# Patient Record
Sex: Female | Born: 1935 | Race: Black or African American | Hispanic: No | Marital: Married | Smoking: Never smoker
Health system: Southern US, Community
[De-identification: ages and names within clinical notes are randomized; demographics above are authoritative.]

## PROBLEM LIST (undated history)

## (undated) DIAGNOSIS — H269 Unspecified cataract: Secondary | ICD-10-CM

## (undated) DIAGNOSIS — I639 Cerebral infarction, unspecified: Secondary | ICD-10-CM

## (undated) DIAGNOSIS — E785 Hyperlipidemia, unspecified: Secondary | ICD-10-CM

## (undated) DIAGNOSIS — F329 Major depressive disorder, single episode, unspecified: Secondary | ICD-10-CM

## (undated) DIAGNOSIS — F32A Depression, unspecified: Secondary | ICD-10-CM

## (undated) DIAGNOSIS — I10 Essential (primary) hypertension: Secondary | ICD-10-CM

## (undated) HISTORY — DX: Major depressive disorder, single episode, unspecified: F32.9

## (undated) HISTORY — PX: TUBAL LIGATION: SHX77

## (undated) HISTORY — PX: OTHER SURGICAL HISTORY: SHX169

## (undated) HISTORY — PX: HERNIA REPAIR: SHX51

## (undated) HISTORY — DX: Depression, unspecified: F32.A

## (undated) HISTORY — DX: Hyperlipidemia, unspecified: E78.5

## (undated) HISTORY — DX: Unspecified cataract: H26.9

## (undated) HISTORY — PX: TONSILLECTOMY: SUR1361

## (undated) HISTORY — DX: Cerebral infarction, unspecified: I63.9

---

## 1998-08-22 ENCOUNTER — Other Ambulatory Visit: Admission: RE | Admit: 1998-08-22 | Discharge: 1998-08-22 | Payer: Self-pay | Admitting: *Deleted

## 2013-07-30 ENCOUNTER — Ambulatory Visit (INDEPENDENT_AMBULATORY_CARE_PROVIDER_SITE_OTHER): Payer: Medicare Other | Admitting: Internal Medicine

## 2013-07-30 VITALS — BP 172/94 | HR 102 | Temp 99.0°F | Resp 16 | Ht 65.0 in | Wt 136.6 lb

## 2013-07-30 DIAGNOSIS — R030 Elevated blood-pressure reading, without diagnosis of hypertension: Secondary | ICD-10-CM

## 2013-07-30 DIAGNOSIS — R32 Unspecified urinary incontinence: Secondary | ICD-10-CM

## 2013-07-30 DIAGNOSIS — IMO0001 Reserved for inherently not codable concepts without codable children: Secondary | ICD-10-CM

## 2013-07-30 DIAGNOSIS — R350 Frequency of micturition: Secondary | ICD-10-CM

## 2013-07-30 DIAGNOSIS — F411 Generalized anxiety disorder: Secondary | ICD-10-CM

## 2013-07-30 LAB — POCT UA - MICROSCOPIC ONLY
Bacteria, U Microscopic: NEGATIVE
CASTS, UR, LPF, POC: NEGATIVE
CRYSTALS, UR, HPF, POC: NEGATIVE
Mucus, UA: NEGATIVE
Yeast, UA: NEGATIVE

## 2013-07-30 LAB — POCT URINALYSIS DIPSTICK
Bilirubin, UA: NEGATIVE
Blood, UA: NEGATIVE
Glucose, UA: NEGATIVE
KETONES UA: NEGATIVE
LEUKOCYTES UA: NEGATIVE
Nitrite, UA: NEGATIVE
PROTEIN UA: NEGATIVE
Spec Grav, UA: 1.01
UROBILINOGEN UA: 0.2
pH, UA: 5

## 2013-07-30 MED ORDER — SERTRALINE HCL 25 MG PO TABS: 25.0000 mg | ORAL_TABLET | Freq: Every day | ORAL | Status: DC

## 2013-07-30 MED ORDER — NITROFURANTOIN MONOHYD MACRO 100 MG PO CAPS: 100.0000 mg | ORAL_CAPSULE | Freq: Two times a day (BID) | ORAL | Status: DC

## 2013-07-30 NOTE — Progress Notes (Addendum)
Subjective:    Patient ID: Carrie Riley, female    DOB: 14-Sep-1932, 80 y.o.   MRN: [id number]  This chart was scribed for Carrie Siaobert Silvestre Mines, MD by Jarvis Morganaylor Ferguson, Medical Scribe. This patient was seen in Room 1 and the patient's care was started at 12:38 PM. She was referred to me by her granddaughter. Her most recent primary care has been [facility], and  she lives in [place] and would rather not continue to commute. She recently moved to our community.  Chief Complaint  Patient presents with  . Urinary Frequency    x's 1 day  . Hypertension    elevated BP x's 3 days     HPI HPI Comments: Carrie Riley is a 80 y.o. female who presents to the [facility] complaining of possible UTI. Patient states that she has a history of UTI's and she is having similar symptoms to what she had when she had past UTI-although she says her last UTI was discovered after being hospitalized for a fall and injury and that she did not know she had a urinary tract infection at the time. She states she has been hospitalized previously for a bladder infection. She states she is having associated urinary frequency with no nocturia, no fever ordiaphoresis. (After the exam is completed and the lab results discussed she acknowledges that she has had incontinence with urgency on several occasions in the last 24 hours for the first time ever)   Patient states she has a personal history of hypertension but she is not taking any medication for it. Her blood pressure is usually 140-150 systolic at home although she has had elevations particularly when anxious that are much higher than this. She states that she is having some anxiety due to her husband's colon cancer diagnosis. She states that at times is interrupts her sleep. She states that she has also has a personal history of irregular heart beat. She denies any other cardiology problems or h/o MI's. She denies any abdominal pain, fever, flank pain  or edema. Although her husband is better they continue to have significant differences in lifestyle revolving around eating and sleeping which cause her to have great anxiety and disrupted sleep to some degree.  I reviewed the records from  [facility] which display a healthy woman for the most part who has been reluctant to use any medications for hypertension because her home blood pressures appear normal to her and he did not respond to Xanax for her anxiety. She has been reluctant to followup for pulmonary nodule as well. Questions about adrenal adenomas were resolved with a second CT scan in June of 2014.     Review of Systems  Constitutional: Positive for diaphoresis (upon waking up in the morning). Negative for fever.  Cardiovascular: Negative for chest pain, palpitations and leg swelling.  Gastrointestinal: Negative for abdominal pain.  Genitourinary: Positive for frequency. Negative for dysuria, flank pain and decreased urine volume.       Nocturia noted  All other systems reviewed and are negative.      Objective:   Physical Exam  Nursing note and vitals reviewed. Constitutional: She is oriented to person, place, and time. She appears well-developed and well-nourished. No distress.  HENT:  Head: Normocephalic and atraumatic.  Eyes: Conjunctivae and EOM are normal.  Neck: Normal range of motion. No tracheal deviation present.  Cardiovascular: Normal rate.  Exam reveals no gallop and no friction rub.   No murmur heard. Tachycardia Rate  of 100. No carotid bruits. No peripheral edema  Pulmonary/Chest: Effort normal and breath sounds normal. No respiratory distress.  Musculoskeletal: Normal range of motion.  Neurological: She is alert and oriented to person, place, and time.  Skin: Skin is warm and dry.  Psychiatric: She has a normal mood and affect. Her behavior is normal.          Assessment & Plan:  I have completed the patient encounter in its entirety as documented  by the scribe, with editing by me where necessary. Canio Winokur P. Merla Richesoolittle, M.D.  Urinary frequency - Plan: POCT UA - Microscopic Only, POCT urinalysis dipstick, Urine culture  Incontinence of urine-2d hx so will start antibios  Anxiety state, unspecified--needsmeds  Spouse with colon cancer  Elevated BP---follow for now  To estab PCP at 104--appt 2 weeks  Meds ordered this encounter  Medications  . nitrofurantoin, macrocrystal-monohydrate, (MACROBID) 100 MG capsule    Sig: Take 1 capsule (100 mg total) by mouth 2 (two) times daily.    Dispense:  14 capsule    Refill:  0  . sertraline (ZOLOFT) 25 MG tablet    Sig: Take 1 tablet (25 mg total) by mouth daily.    Dispense:  30 tablet    Refill:  1

## 2013-07-31 LAB — URINE CULTURE: Colony Count: 2000

## 2013-08-03 ENCOUNTER — Telehealth: Payer: Self-pay

## 2013-08-03 NOTE — Telephone Encounter (Signed)
PATIENT STATES SHE CAME INTO THE OFFICE TO SEE DR. DOOLITTLE FOR A FEW PROBLEMS ON Saturday. SHE JUST WANTS HIM TO KNOW  THAT SHE WILL FOLLOW-UP WITH HER PRIMARY CARE PHYSICIAN. SHE WILL RETURN BACK TO [facility] IF SHE HAS ANOTHER EMERGENCY. BEST PHONE [phone] (HOME)   MBC

## 2013-08-06 ENCOUNTER — Other Ambulatory Visit: Payer: Self-pay | Admitting: Internal Medicine

## 2013-08-09 ENCOUNTER — Encounter: Payer: Self-pay | Admitting: Radiology

## 2013-08-14 ENCOUNTER — Telehealth: Payer: Self-pay | Admitting: *Deleted

## 2013-08-14 NOTE — Telephone Encounter (Signed)
Pt reports she received unable to reach letter in mail regarding lab work. I informed her of results. She reports that the incontinence she was having has sense stopped since taking medication she was prescribed .

## 2013-10-18 HISTORY — PX: CATARACT EXTRACTION: SUR2

## 2014-05-11 ENCOUNTER — Ambulatory Visit (INDEPENDENT_AMBULATORY_CARE_PROVIDER_SITE_OTHER): Payer: Medicare Other | Admitting: Family Medicine

## 2014-05-11 VITALS — BP 166/80 | HR 102 | Temp 98.3°F | Resp 18 | Ht 62.0 in | Wt 138.2 lb

## 2014-05-11 DIAGNOSIS — I10 Essential (primary) hypertension: Secondary | ICD-10-CM | POA: Diagnosis not present

## 2014-05-11 DIAGNOSIS — N3941 Urge incontinence: Secondary | ICD-10-CM | POA: Diagnosis not present

## 2014-05-11 DIAGNOSIS — S9001XA Contusion of right ankle, initial encounter: Secondary | ICD-10-CM

## 2014-05-11 DIAGNOSIS — W19XXXA Unspecified fall, initial encounter: Secondary | ICD-10-CM | POA: Diagnosis not present

## 2014-05-11 DIAGNOSIS — S8001XA Contusion of right knee, initial encounter: Secondary | ICD-10-CM | POA: Diagnosis not present

## 2014-05-11 LAB — POCT UA - MICROSCOPIC ONLY
Casts, Ur, LPF, POC: NEGATIVE
Crystals, Ur, HPF, POC: NEGATIVE
Mucus, UA: NEGATIVE
YEAST UA: NEGATIVE

## 2014-05-11 LAB — POCT URINALYSIS DIPSTICK
BILIRUBIN UA: NEGATIVE
Glucose, UA: NEGATIVE
KETONES UA: NEGATIVE
Nitrite, UA: NEGATIVE
Protein, UA: NEGATIVE
Spec Grav, UA: 1.01
Urobilinogen, UA: 0.2
pH, UA: 6

## 2014-05-11 MED ORDER — CIPROFLOXACIN HCL 250 MG PO TABS: 250.0000 mg | ORAL_TABLET | Freq: Two times a day (BID) | ORAL | Status: DC

## 2014-05-11 MED ORDER — DOCUSATE SODIUM 100 MG PO CAPS: 100.0000 mg | ORAL_CAPSULE | Freq: Two times a day (BID) | ORAL | Status: DC

## 2014-05-11 NOTE — Progress Notes (Addendum)
Subjective:   This chart was scribed for Carrie Simmer, MD by Carrie Riley, ED Scribe. This patient was seen in Room 13 and the patient's care was started at 9:22 PM.   Patient ID: Carrie Riley, female    DOB: 1932/07/11, 81 y.o.   MRN: [id number]  05/11/2014  Urinary Incontinence and Leg Swelling  HPI  HPI Comments: Carrie Riley is a 81 y.o. female who presents to the [facility] complaining of a possible UTI, pt is unsure to when onset of symptoms began. She is having associated urinary frequency, urgency and urinary incontinence. Pt states that she sometimes wears depends. She has also been having constipation for 1 week in which she takes laxatives. Pt was last seen at [facility] in June 2015 for UTI like symptoms. Per notes from that visit pt tested negative for UTI by Urine culture.  Urinary incontinence improved within one week of treatment of presumed UTI.     Pt began a new BP medication, amlodipine, in January 2016. Pt regularly checks her BP at home. Pt states she has diaphoresis intermittently at baseline since she went through menopause. She states she recently fell twice today and has swelling in her right leg and pain in right ankle. Pt notes she has bilateral swelling in her legs at baseline. She denies pain with ambulation. She denies twisting her ankle but does have some mild R lateral ankle pain s/p fall today. Pt regularly exercises and tries to walk about 1 mile each day. She denies dysuria, back pain, hematuria, fever, chills, nausea, vomiting, abdominal pain, chest pain, SOB, dizziness or HA.  She is not sure why she fell twice today but reports that she fell when she was suffering with urinary incontinence nine months ago.  Denies n/t in legs.  Denies lower back pain.  Denies saddle paresthesias.  Having some mild R lateral knee pain as well but no pain in knee with ambulation or range of motion of R knee.  Upon review of recent visit with NP Regan, pt was  advised to take both Amlodipine and Losartan. Pt has been taking Amlodipine but has not been taking Losartan. Pt is worried about elevated BP today however does not want to take both medications. Pt wants to work on diet to improve BP.  Denies HA/dizziness/paresthesias/focal weakness.  Denies CP/palp/SOB.   PCP: Louie Boston, NP  Review of Systems  Constitutional: Negative for fever, chills, diaphoresis and fatigue.  HENT: Negative for congestion.   Respiratory: Negative for cough and shortness of breath.   Cardiovascular: Positive for leg swelling. Negative for chest pain and palpitations.  Gastrointestinal: Positive for constipation. Negative for nausea, vomiting, abdominal pain, diarrhea, blood in stool, abdominal distention, anal bleeding and rectal pain.  Genitourinary: Positive for urgency and frequency. Negative for dysuria, hematuria, flank pain and pelvic pain.  Musculoskeletal: Positive for myalgias, joint swelling and arthralgias. Negative for back pain, neck pain and neck stiffness.  Skin: Negative for wound.  Neurological: Negative for dizziness, tremors, syncope, facial asymmetry, speech difficulty, weakness, light-headedness, numbness and headaches.  Hematological: Does not bruise/bleed easily.    Past Medical History  Diagnosis Date  . Cataract   . Hypertension   . Depression    Past Surgical History  Procedure Laterality Date  . Tubal ligation     No Known Allergies History   Social History  . Marital Status: Married    Spouse Name: N/A  . Number of Children: N/A  . Years of  Education: N/A   Occupational History  . Not on file.   Social History Main Topics  . Smoking status: Never Smoker   . Smokeless tobacco: Not on file  . Alcohol Use: Not on file  . Drug Use: Not on file  . Sexual Activity: Not on file   Other Topics Concern  . Not on file   Social History Narrative        Objective:    Triage Vitals: BP 166/80 mmHg  Pulse 102  Temp(Src)  98.3 F (36.8 C) (Oral)  Resp 18  Ht 5\' 2"  (1.575 m)  Wt 138 lb 4 oz (62.71 kg)  BMI 25.28 kg/m2  SpO2 98%  Physical Exam  Constitutional: She is oriented to person, place, and time. She appears well-developed and well-nourished. No distress.  HENT:  Head: Normocephalic and atraumatic.  Right Ear: External ear normal.  Left Ear: External ear normal.  Nose: Nose normal.  Mouth/Throat: Oropharynx is clear and moist.  Eyes: Conjunctivae and EOM are normal. Pupils are equal, round, and reactive to light.  Neck: Normal range of motion. Neck supple. Carotid bruit is not present. No tracheal deviation present. No thyromegaly present.  Cardiovascular: Normal rate, regular rhythm, normal heart sounds and intact distal pulses.  Exam reveals no gallop and no friction rub.   No murmur heard. Pulmonary/Chest: Effort normal and breath sounds normal. No respiratory distress. She has no wheezes. She has no rales.  Abdominal: Soft. Bowel sounds are normal. She exhibits no distension and no mass. There is no tenderness. There is no rebound, no guarding and no CVA tenderness.  Musculoskeletal:       Right knee: She exhibits normal range of motion, no swelling and no effusion. Tenderness found. Lateral joint line tenderness noted. No medial joint line and no patellar tendon tenderness noted.       Right ankle: She exhibits swelling. She exhibits normal range of motion. Tenderness. Lateral malleolus tenderness found. No medial malleolus, no posterior TFL, no head of 5th metatarsal and no proximal fibula tenderness found. Achilles tendon normal.       Lumbar back: She exhibits normal range of motion, no tenderness, no bony tenderness, no pain and no spasm.       Right lower leg: Normal. She exhibits no tenderness, no bony tenderness, no swelling and no edema.       Right foot: Normal. There is normal range of motion, no tenderness and no bony tenderness.  Trace pitting edema bilateral of lower extremities.  Mildly painful ROM of right ankle. Tenderness to lateral malleolus.  Normal gait.  Full ROM of R knee without limitation and without pain.   Lymphadenopathy:    She has no cervical adenopathy.  Neurological: She is alert and oriented to person, place, and time. No cranial nerve deficit. She exhibits normal muscle tone. Coordination normal.  Skin: Skin is warm and dry. No rash noted. She is not diaphoretic. No erythema. No pallor.  Psychiatric: She has a normal mood and affect. Her behavior is normal. Judgment and thought content normal.  Nursing note and vitals reviewed.       Assessment & Plan:   1. Urge incontinence of urine   2. Essential hypertension, benign   3. Falls, initial encounter   4. Contusion of right knee, initial encounter   5. Contusion of right ankle, initial encounter     1.  Urge Incontinence:  New/recurrent. Send urine culture. Treat empirically with Cipro while awaiting cx.  Treat  constipation. 2.  HTN:  Elevated; encourage patient to restart Losartan and to continue Amlodipine. Goal BP for age is 150/80. 3. Falls recurrent:  New today; normal neurological exam; benign exam in office.  Obtain labs. May be related to acute UTI.  If recurs, recommend follow-up with PCP. 4.  Contusion R lateral ankle: New. Pt declined xray; normal gait; recommend rest, ice, elevation, supportive shoes. 5. Contusion R lateral knee: New. Secondary to fall; full ROM; benign knee exam; recommend rest, icing, elevation, supportive tennis shoe.   Meds ordered this encounter  Medications  . losartan (COZAAR) 100 MG tablet    Sig: Take 100 mg by mouth daily.  Marland Kitchen amLODipine (NORVASC) 5 MG tablet    Sig: Take 5 mg by mouth daily.  . ciprofloxacin (CIPRO) 250 MG tablet    Sig: Take 1 tablet (250 mg total) by mouth 2 (two) times daily.    Dispense:  14 tablet    Refill:  0  . docusate sodium (COLACE) 100 MG capsule    Sig: Take 1 capsule (100 mg total) by mouth 2 (two) times daily.     Dispense:  60 capsule    Refill:  0    No Follow-up on file.  I personally performed the services described in this documentation, which was scribed in my presence. The recorded information has been reviewed and considered.  Aldred Mase Paulita Fujita, M.D. [facility] [address] [place], [place]  [address] [phone] phone [phone] fax

## 2014-05-11 NOTE — Patient Instructions (Signed)
1.  Recommend taking Losartan 100mg  once daily AND Amlodipine 5mg  one tablet daily. 2. Recommend taking Cipro 500mg  one tablet twice daily. 3.  Recommend Colace 100mg  one tablet twice daily.

## 2014-05-12 LAB — COMPREHENSIVE METABOLIC PANEL
ALBUMIN: 3.9 g/dL (ref 3.5–5.2)
ALK PHOS: 61 U/L (ref 39–117)
ALT: 19 U/L (ref 0–35)
AST: 19 U/L (ref 0–37)
BUN: 23 mg/dL (ref 6–23)
CHLORIDE: 106 meq/L (ref 96–112)
CO2: 26 mEq/L (ref 19–32)
Calcium: 9.6 mg/dL (ref 8.4–10.5)
Creat: 1.15 mg/dL — ABNORMAL HIGH (ref 0.50–1.10)
GLUCOSE: 115 mg/dL — AB (ref 70–99)
Potassium: 5.1 mEq/L (ref 3.5–5.3)
Sodium: 140 mEq/L (ref 135–145)
TOTAL PROTEIN: 6.9 g/dL (ref 6.0–8.3)
Total Bilirubin: 0.5 mg/dL (ref 0.2–1.2)

## 2014-05-12 LAB — CBC WITH DIFFERENTIAL/PLATELET
BASOS PCT: 0 % (ref 0–1)
Basophils Absolute: 0 10*3/uL (ref 0.0–0.1)
EOS ABS: 0.2 10*3/uL (ref 0.0–0.7)
Eosinophils Relative: 2 % (ref 0–5)
HCT: 39.5 % (ref 36.0–46.0)
HEMOGLOBIN: 13.1 g/dL (ref 12.0–15.0)
Lymphocytes Relative: 42 % (ref 12–46)
Lymphs Abs: 3.7 10*3/uL (ref 0.7–4.0)
MCH: 29.8 pg (ref 26.0–34.0)
MCHC: 33.2 g/dL (ref 30.0–36.0)
MCV: 90 fL (ref 78.0–100.0)
MPV: 10.2 fL (ref 8.6–12.4)
Monocytes Absolute: 0.6 10*3/uL (ref 0.1–1.0)
Monocytes Relative: 7 % (ref 3–12)
NEUTROS ABS: 4.4 10*3/uL (ref 1.7–7.7)
NEUTROS PCT: 49 % (ref 43–77)
Platelets: 233 10*3/uL (ref 150–400)
RBC: 4.39 MIL/uL (ref 3.87–5.11)
RDW: 13.9 % (ref 11.5–15.5)
WBC: 8.9 10*3/uL (ref 4.0–10.5)

## 2014-05-13 ENCOUNTER — Encounter: Payer: Self-pay | Admitting: Family Medicine

## 2014-05-13 LAB — URINE CULTURE: Colony Count: 65000

## 2014-05-15 ENCOUNTER — Telehealth: Payer: Self-pay

## 2014-05-15 NOTE — Telephone Encounter (Signed)
Patient's granddaughter, [name], called to say that Carrie Riley is still experiencing urinary frequency and urgency.  She wants to know if she needs a different antibiotic.  She was originally given Cipro at her visit on 05/11/14.  She would also like the results of the urinalysis.  She requests a call back on behalf of her grandmother; she is listed on the patient's DPR.  Please advise.  Thank you.  CB#: [phone]

## 2014-05-16 NOTE — Telephone Encounter (Signed)
Please review labs and let me know. Since Dr. Katrinka BlazingSmith is off can someone review today?

## 2014-05-17 NOTE — Telephone Encounter (Signed)
Called and spoke with pts grand daughter. Informed her ua showed small leukocytes, trace blood. Culture came back with no predominant organisms, recollection recommended if clinically indicated. She spoke to pt while I was on the phone. Pt stated her freq and urgency is improved. She is having no issues making it to the bathroom. They were instructed to complete the cipro course and that there would be no indication to change abx at this time. Instructed to rtc with urgency, freq, or any mental status changes. Grand daughter expressed understanding.

## 2014-05-17 NOTE — Telephone Encounter (Signed)
I tried to call and speak with patient's granddaughter but got a message machine. I did not leave a message.  Dr. Katrinka BlazingSmith diagnosed her with urge incontinence, and was covering for a UTI with Cipro. Pt's urine showed some leukocytes and trace RBC's but was neg for nitrates. It was also positive for epithelial cells, so it was not the best specimen to begin with.  Culture shows multiple bacterial species without mention of gram negatives.  Given all that, she may or may not have had a UTI, and may just be suffering from incontinence.  Before pursuing medical therapy for this I do recommend she come back for another urine and culture.  I do not recommend she try another antibiotic without retesting.  Deliah BostonMichael Clark, MS, PA-C   2:37 PM, 05/17/2014

## 2014-08-24 ENCOUNTER — Ambulatory Visit: Payer: Medicare Other | Admitting: Podiatry

## 2014-08-25 ENCOUNTER — Ambulatory Visit: Payer: Medicare Other | Admitting: Podiatry

## 2014-09-12 ENCOUNTER — Encounter: Payer: Self-pay | Admitting: *Deleted

## 2014-09-13 ENCOUNTER — Ambulatory Visit (INDEPENDENT_AMBULATORY_CARE_PROVIDER_SITE_OTHER): Payer: Medicare Other | Admitting: Diagnostic Neuroimaging

## 2014-09-13 ENCOUNTER — Encounter: Payer: Self-pay | Admitting: Diagnostic Neuroimaging

## 2014-09-13 VITALS — BP 156/82 | HR 86 | Ht 64.0 in | Wt 135.0 lb

## 2014-09-13 DIAGNOSIS — I639 Cerebral infarction, unspecified: Secondary | ICD-10-CM

## 2014-09-13 DIAGNOSIS — I6340 Cerebral infarction due to embolism of unspecified cerebral artery: Secondary | ICD-10-CM | POA: Diagnosis not present

## 2014-09-13 NOTE — Progress Notes (Signed)
[facility]  PATIENT: Carrie Riley DOB: [birth year]  REFERRING CLINICIAN: Regan  HISTORY FROM: patient and husband and daughter  REASON FOR VISIT: new consult    HISTORICAL  CHIEF COMPLAINT:  Chief Complaint  Patient presents with  . Cerebrovascular Accident    rm 6,  New Patient, husband - Fayrene Fearing, dgtr Dorthenia    HISTORY OF PRESENT ILLNESS:   81 year old right-handed female with hypertension, hypercholesteremia, here for evaluation of possible stroke. Last month agent had a fall on 08/13/2014. She noted right arm and right leg weakness. She declined going to the hospital for evaluation. Patient is also stopped all of her prescribed medications on her own. She does not want to take any medications would rather place her faith in God to help her.  Patient followed up with PCP at the insistence of family. They were concerned for stroke possibility and therefore referred patient to me for further evaluation.  Patient still has significant right arm and right leg weakness. She denies any numbness in the right side. She did have some slurred speech and trouble with word finding difficulty. No headaches or chest pain.  Patient declines any further testing at this time.  REVIEW OF SYSTEMS: Full 14 system review of systems performed and notable only for memory loss confusion numbness weakness slurred speech passing out insomnia snoring restless legs decreased energy blurred vision weight loss swelling in legs hearing loss urination problems incontinence allergies aching muscles joint pain feeling hot feeling cold anemia blurred vision.   ALLERGIES: Allergies  Allergen Reactions  . Ciprofloxacin Rash    On bilateral arms  . Citric Acid Hives    HOME MEDICATIONS: Outpatient Prescriptions Prior to Visit  Medication Sig Dispense Refill  . amLODipine (NORVASC) 5 MG tablet Take 5 mg by mouth daily.    Marland Kitchen losartan (COZAAR) 100 MG tablet Take 100 mg by mouth daily.      . sertraline (ZOLOFT) 25 MG tablet Take 1 tablet (25 mg total) by mouth daily. (Patient not taking: Reported on 09/13/2014) 30 tablet 1  . ciprofloxacin (CIPRO) 250 MG tablet Take 1 tablet (250 mg total) by mouth 2 (two) times daily. 14 tablet 0  . docusate sodium (COLACE) 100 MG capsule Take 1 capsule (100 mg total) by mouth 2 (two) times daily. 60 capsule 0  . nitrofurantoin, macrocrystal-monohydrate, (MACROBID) 100 MG capsule Take 1 capsule (100 mg total) by mouth 2 (two) times daily. 14 capsule 0   No facility-administered medications prior to visit.    PAST MEDICAL HISTORY: Past Medical History  Diagnosis Date  . Cataract   . Hypertension   . Depression   . Hyperlipidemia   . Stroke     PAST SURGICAL HISTORY: Past Surgical History  Procedure Laterality Date  . Tubal ligation    . Hernia repair      hiatal, years ago  . Cataract extraction Right 10/2013  . Tonsillectomy    . Iud removed      due to infection    FAMILY HISTORY: Family History  Problem Relation Age of Onset  . Diabetes Mother   . Diabetes Father   . Alzheimer's disease Father     SOCIAL HISTORY:  History   Social History  . Marital Status: Married    Spouse Name: Fayrene Fearing  . Number of Children: 2  . Years of Education: 12   Occupational History  . retired     Chief Strategy Officer   Social History Main Topics  . Smoking  status: Never Smoker   . Smokeless tobacco: Not on file  . Alcohol Use: No  . Drug Use: Not on file  . Sexual Activity: Not on file   Other Topics Concern  . Not on file   Social History Narrative   Married to Randall, lives at home   No caffeine     PHYSICAL EXAM  GENERAL EXAM/CONSTITUTIONAL: Vitals:  Filed Vitals:   09/13/14 1108  BP: 156/82  Pulse: 86  Height: 5\' 4"  (1.626 m)  Weight: 135 lb (61.236 kg)     Body mass index is 23.16 kg/(m^2).  Visual Acuity Screening   Right eye Left eye Both eyes  Without correction: 20/20    With correction:      Comments: 09/13/14 Left eye &quot;fuzzy&quot;, unable to see letters    Patient is in no distress; well developed, nourished and groomed; neck is supple  CARDIOVASCULAR:  Examination of carotid arteries is normal; no carotid bruits  Regular rate and rhythm, no murmurs  Examination of peripheral vascular system by observation and palpation is normal  EYES:  Ophthalmoscopic exam of optic discs and posterior segments is normal; no papilledema or hemorrhages  MUSCULOSKELETAL:  Gait, strength, tone, movements noted in Neurologic exam below  NEUROLOGIC: MENTAL STATUS:  No flowsheet data found.  awake, alert, oriented to person, place and time  recent and remote memory intact  normal attention and concentration  language fluent, comprehension intact, naming intact; MILD HESITANCE IN SPEECH  fund of knowledge appropriate  CRANIAL NERVE:   2nd - no papilledema on fundoscopic exam  2nd, 3rd, 4th, 6th - pupils equal and reactive to light, visual fields full to confrontation, extraocular muscles intact, no nystagmus  5th - facial sensation symmetric  7th - facial strength symmetric  8th - hearing intact  9th - palate elevates symmetrically, uvula midline  11th - shoulder shrug symmetric  12th - tongue protrusion midline  MOTOR:   normal bulk and tone, full strength in the LUE, LLE; RUE 3; RLE 2-3; DECR FINGER TAP ON RIGHT; DECR FOOT TAP ON RIGHT  SENSORY:   normal and symmetric to light touch, temperature, vibration  COORDINATION:   finger-nose-finger, fine finger movements SLOW ON RIGHT  REFLEXES:   deep tendon reflexes: RUE 3+, LUE 3; KNEES 2; ANKLES 1  GAIT/STATION:   RIGHT HEMIPARETIC GAIT; SLOW TO RISE; USES A 4 POINT CANE; UNSTEADY; SHORT SMALL STEPS     DIAGNOSTIC DATA (LABS, IMAGING, TESTING) - I reviewed patient records, labs, notes, testing and imaging myself where available.  Lab Results  Component Value Date   WBC 8.9 05/11/2014    HGB 13.1 05/11/2014   HCT 39.5 05/11/2014   MCV 90.0 05/11/2014   PLT 233 05/11/2014      Component Value Date/Time   NA 140 05/11/2014 2159   K 5.1 05/11/2014 2159   CL 106 05/11/2014 2159   CO2 26 05/11/2014 2159   GLUCOSE 115* 05/11/2014 2159   BUN 23 05/11/2014 2159   CREATININE 1.15* 05/11/2014 2159   CALCIUM 9.6 05/11/2014 2159   PROT 6.9 05/11/2014 2159   ALBUMIN 3.9 05/11/2014 2159   AST 19 05/11/2014 2159   ALT 19 05/11/2014 2159   ALKPHOS 61 05/11/2014 2159   BILITOT 0.5 05/11/2014 2159   No results found for: CHOL, HDL, LDLCALC, LDLDIRECT, TRIG, CHOLHDL No results found for: LOVF6E No results found for: VITAMINB12 No results found for: TSH   09/06/14 Carotid u/s  - No evidence of hemodynamically significant  stenosis in either carotid system as described. - Antegrade flow within both vertebral arteries. - Bilateral common carotid arteries demonstrate mild atherosclerotic changes.  09/06/14 CT head  - No CT evidence of acute intracranial abnormality. Although no evidence of acute infarction, mass, or hemorrhage is seen, CT is relatively insensitive for the detection of hypoxia/ischemia within the first 24-48 hours, and an MRI scan may be indicated.   09/06/14 EKG - normal  09/05/14 Labs: LDL 108, HDL 55, CHOL 173, TRIG 104, A1C 5.9    ASSESSMENT AND PLAN  81 y.o. year old female here with new onset right arm and leg weakness, slurred speech, word finding difficulty. Suspect left brain stroke. Risk factors include HTN and hypercholesteremia. I encouraged patient to proceed with further workup and treatment options to confirm diagnosis, help reduce risk for additional stroke or heart attack, as well as help patient with activities of daily living and general mobility. Unfortunately patient is quite adamant about pursuing any additional testing, trying any additional medications or receiving any help at home. Hopefully her family will be able to supervise her and  convince her to pursue a different course.   Dx: suspected left brain stroke   PLAN: - start aspirin  daily (patient declines) - start BP medication for better control (patient declines) - check MRI brain (patient declines) - check TTE and cardiac monitoring to rule out cardioembolic sources such as atrial fibrillation (patient declines) - consider rollator walker (patient declines) - consider home health PT/OT/ST therapies (patient and family will discuss with PCP)  Return if symptoms worsen or fail to improve, for return to PCP.    [name], MD 09/13/2014, 11:48 AM Certified in Neurology, Neurophysiology and Neuroimaging  [facility] [address] [place], [place] [address] [phone]

## 2014-09-15 ENCOUNTER — Telehealth: Payer: Self-pay | Admitting: Diagnostic Neuroimaging

## 2014-09-15 NOTE — Telephone Encounter (Signed)
Office note successfully faxed to number given.

## 2014-09-15 NOTE — Telephone Encounter (Signed)
Danielle with [facility] is calling. They received an order for the patient from her PCP for Physical Therapy but did not receive any office notes from the 09-13-14 office visit at our office. Please fax notes to [phone]. If there are any questions please call.

## 2014-09-22 ENCOUNTER — Telehealth: Payer: Self-pay

## 2014-09-22 NOTE — Telephone Encounter (Signed)
Dr. Katrinka Blazing   Patient would like for you to call her regarding an oncologist.    [phone]

## 2014-09-26 NOTE — Telephone Encounter (Signed)
Please call pt for details; I have only seen her one time in 04/2014 for a UTI. ( I am not her PCP.)

## 2014-09-27 NOTE — Telephone Encounter (Signed)
Called pt no answer °

## 2014-10-24 ENCOUNTER — Encounter: Payer: Self-pay | Admitting: Diagnostic Neuroimaging

## 2014-10-24 NOTE — Progress Notes (Signed)
I received outside records and review today. CT scan head 09/06/14 shows "no evidence of acute intracranial abnormality". Carotid ultrasound shows "no evidence of hemodynamically significant stenosis in either carotid system" and "antegrade flow within both vertebral arteries".   [name], MD 10/24/2014, 12:44 PM Certified in Neurology, Neurophysiology and Neuroimaging  [facility] [address], [address] [place], [place] [address] [phone]

## 2014-11-23 ENCOUNTER — Ambulatory Visit (INDEPENDENT_AMBULATORY_CARE_PROVIDER_SITE_OTHER): Payer: Medicare Other | Admitting: Podiatry

## 2014-11-23 ENCOUNTER — Encounter: Payer: Self-pay | Admitting: Podiatry

## 2014-11-23 VITALS — BP 159/93 | HR 117 | Resp 16

## 2014-11-23 DIAGNOSIS — M2040 Other hammer toe(s) (acquired), unspecified foot: Secondary | ICD-10-CM | POA: Diagnosis not present

## 2014-11-23 DIAGNOSIS — M79606 Pain in leg, unspecified: Secondary | ICD-10-CM

## 2014-11-23 DIAGNOSIS — B351 Tinea unguium: Secondary | ICD-10-CM | POA: Diagnosis not present

## 2014-11-23 DIAGNOSIS — M21619 Bunion of unspecified foot: Secondary | ICD-10-CM | POA: Diagnosis not present

## 2014-11-23 NOTE — Progress Notes (Signed)
   Subjective:    Patient ID: Carrie Riley, female    DOB: 03-01-1932, 81 y.o.   MRN: [id number]  HPI Pt presents with bilateral bunions and hammetoes and painful thickened nails 1-5 bilateral   Review of Systems  All other systems reviewed and are negative.      Objective:   Physical Exam        Assessment & Plan:

## 2014-11-25 NOTE — Progress Notes (Signed)
Subjective:     Patient ID: Carrie Riley, female   DOB: 05/08/1932, 81 y.o.   MRN: [id number]  HPI patient presents with significant structural malalignment of both feet with digital deformities thick nailbeds that are painful when pressed and concerns by caregiver of shoe gear that might be too tight   Review of Systems     Objective:   Physical Exam  Constitutional: She is oriented to person, place, and time.  Musculoskeletal: Normal range of motion.  Neurological: She is oriented to person, place, and time.  Skin: Skin is warm and dry.  Nursing note and vitals reviewed.  vascular status found to be diminished with diminishment of both sharp dull and vibratory and neurological. Significant structural malalignment with large hyperostosis medial aspect and digital deformities with elevation of the lesser digits noted. Patient's found to have thick yellow nailbeds 1-5 both feet that are brittle and painful when pressed and makes shoe gear difficult     Assessment:     Abnormal foot structure with mycotic nail infections that are painful 1-5 both feet and diminished circulatory status    Plan:     H&P and reviewed shoe gear and making sure she has wide shoes that are appropriate. Debrided nailbeds 1-5 both feet with no iatrogenic bleeding noted and reappoint to recheck

## 2014-12-01 ENCOUNTER — Emergency Department (HOSPITAL_COMMUNITY)
Admission: EM | Admit: 2014-12-01 | Discharge: 2014-12-01 | Disposition: A | Discharge status: Home / Self Care | Payer: Medicare Other | Attending: Emergency Medicine | Admitting: Emergency Medicine

## 2014-12-01 ENCOUNTER — Encounter (HOSPITAL_COMMUNITY): Payer: Self-pay | Admitting: Family Medicine

## 2014-12-01 ENCOUNTER — Emergency Department (HOSPITAL_COMMUNITY): Payer: Medicare Other

## 2014-12-01 DIAGNOSIS — S79911A Unspecified injury of right hip, initial encounter: Secondary | ICD-10-CM | POA: Diagnosis not present

## 2014-12-01 DIAGNOSIS — Z8639 Personal history of other endocrine, nutritional and metabolic disease: Secondary | ICD-10-CM | POA: Insufficient documentation

## 2014-12-01 DIAGNOSIS — F329 Major depressive disorder, single episode, unspecified: Secondary | ICD-10-CM | POA: Diagnosis not present

## 2014-12-01 DIAGNOSIS — Y939 Activity, unspecified: Secondary | ICD-10-CM | POA: Diagnosis not present

## 2014-12-01 DIAGNOSIS — Z8669 Personal history of other diseases of the nervous system and sense organs: Secondary | ICD-10-CM | POA: Insufficient documentation

## 2014-12-01 DIAGNOSIS — Z79899 Other long term (current) drug therapy: Secondary | ICD-10-CM | POA: Diagnosis not present

## 2014-12-01 DIAGNOSIS — Y999 Unspecified external cause status: Secondary | ICD-10-CM | POA: Insufficient documentation

## 2014-12-01 DIAGNOSIS — I10 Essential (primary) hypertension: Secondary | ICD-10-CM | POA: Diagnosis not present

## 2014-12-01 DIAGNOSIS — R531 Weakness: Secondary | ICD-10-CM | POA: Diagnosis not present

## 2014-12-01 DIAGNOSIS — Z8673 Personal history of transient ischemic attack (TIA), and cerebral infarction without residual deficits: Secondary | ICD-10-CM | POA: Insufficient documentation

## 2014-12-01 DIAGNOSIS — S3992XA Unspecified injury of lower back, initial encounter: Secondary | ICD-10-CM | POA: Diagnosis present

## 2014-12-01 DIAGNOSIS — W19XXXA Unspecified fall, initial encounter: Secondary | ICD-10-CM

## 2014-12-01 DIAGNOSIS — W1789XA Other fall from one level to another, initial encounter: Secondary | ICD-10-CM | POA: Insufficient documentation

## 2014-12-01 DIAGNOSIS — Y929 Unspecified place or not applicable: Secondary | ICD-10-CM | POA: Diagnosis not present

## 2014-12-01 MED ORDER — HYDROCODONE-ACETAMINOPHEN 5-325 MG PO TABS
1.0000 | ORAL_TABLET | Freq: Once | ORAL | Status: AC
  Administered 2014-12-01: 1 via ORAL
  Filled 2014-12-01: qty 1

## 2014-12-01 MED ORDER — IBUPROFEN 800 MG PO TABS: 800.0000 mg | ORAL_TABLET | Freq: Once | ORAL | Status: DC

## 2014-12-01 MED ORDER — HYDROCODONE-ACETAMINOPHEN 5-325 MG PO TABS: 2.0000 | ORAL_TABLET | ORAL | Status: DC | PRN

## 2014-12-01 NOTE — ED Notes (Signed)
Pt sts she had a fall a few hours ago. sts her right leg gave out and she fell hurting the right side of her bottom. Denies any other problems.

## 2014-12-01 NOTE — Discharge Instructions (Signed)
Follow-up with primary care provider if symptoms aren't improved. Return to the emergency department if you experience additional fall, increasing ear pain, inability to move an extremity, numbness or tingling in any extremity, discoloration of extremity.

## 2014-12-01 NOTE — ED Provider Notes (Signed)
CSN: [id number]     Arrival date & time 12/01/14  1331 History   First MD Initiated Contact with Patient 12/01/14 1704     Chief Complaint  Patient presents with  . Fall     (Consider location/radiation/quality/duration/timing/severity/associated sxs/prior Treatment) HPI  Carrie Riley is an 81 y.o F with pmhx of stroke 2 months ago who presents to the ED today to be evaluated after a mechanical fall that occurred earlier today. Pt states that she has had right sided weakness deficits since her last stroke. Pt was ambulating with a walker when she tripped over her right foot and fell backwards onto her tailbone and lower back. Denied LOC or head injury. Fall was witnessed by daughter who accompanies pt in the ER today. Pt complaining of pain in the R side of her tailbone and lower back. Denies numbness/tingling, dizziness, headache, bowel/bladder incontinence, saddle anesthesia, CP, SOB.  Past Medical History  Diagnosis Date  . Cataract   . Hypertension   . Depression   . Hyperlipidemia   . Stroke [facility])    Past Surgical History  Procedure Laterality Date  . Tubal ligation    . Hernia repair      hiatal, years ago  . Cataract extraction Right 10/2013  . Tonsillectomy    . Iud removed      due to infection   Family History  Problem Relation Age of Onset  . Diabetes Mother   . Diabetes Father   . Alzheimer's disease Father    Social History  Substance Use Topics  . Smoking status: Never Smoker   . Smokeless tobacco: None  . Alcohol Use: No   OB History    No data available     Review of Systems  All other systems reviewed and are negative.     Allergies  Ciprofloxacin and Citric acid  Home Medications   Prior to Admission medications   Medication Sig Start Date End Date Taking? Authorizing Provider  Celery Seed OIL Take 120 mLs by mouth 4 (four) times daily.   Yes Historical Provider, MD  Ginkgo Biloba (GINKOBA PO) Take 1 tablet by mouth daily.    Yes  Historical Provider, MD  magnesium 30 MG tablet Take 30 mg by mouth 2 (two) times daily.    Yes Historical Provider, MD  Misc Natural Products (TURMERIC CURCUMIN) CAPS Take 1 capsule by mouth 2 (two) times daily.   Yes Historical Provider, MD  amLODipine (NORVASC) 5 MG tablet Take 5 mg by mouth daily.    Historical Provider, MD  losartan (COZAAR) 100 MG tablet Take 100 mg by mouth daily.    Historical Provider, MD  sertraline (ZOLOFT) 25 MG tablet Take 1 tablet (25 mg total) by mouth daily. 07/30/13   [name], MD   BP 166/90 mmHg  Pulse 105  Temp(Src) 98.3 F (36.8 C)  Resp 18  SpO2 100% Physical Exam  Constitutional: She is oriented to person, place, and time. She appears well-developed and well-nourished. No distress.  HENT:  Head: Normocephalic and atraumatic.  Mouth/Throat: Oropharynx is clear and moist. No oropharyngeal exudate.  Eyes: Conjunctivae and EOM are normal. Pupils are equal, round, and reactive to light. Right eye exhibits no discharge. Left eye exhibits no discharge. No scleral icterus.  Neck: Normal range of motion. Neck supple.  Cardiovascular: Normal rate, regular rhythm, normal heart sounds and intact distal pulses.  Exam reveals no gallop and no friction rub.   No murmur heard. Pulmonary/Chest: Effort normal  and breath sounds normal. No respiratory distress. She has no wheezes. She has no rales. She exhibits no tenderness.  Abdominal: Soft. She exhibits no distension and no mass. There is no tenderness. There is no rebound and no guarding.  Musculoskeletal: Normal range of motion. She exhibits no edema.  TTP of sacrum and coccyx. No obvious bony deformity. No edema or ecchymosis.   Lymphadenopathy:    She has no cervical adenopathy.  Neurological: She is alert and oriented to person, place, and time.  R sided weakness. Strength 3/5 in RUE and RLE. This is pt baseline since previous stroke.   Strength 5/5 in LUE and LLE. No sensory deficits.   Pt walks  with a walker due to RLE weakness.  Skin: Skin is warm and dry. No rash noted. She is not diaphoretic. No erythema. No pallor.  Psychiatric: She has a normal mood and affect. Her behavior is normal.  Nursing note and vitals reviewed.   ED Course  Procedures (including critical care time) Labs Review Labs Reviewed - No data to display  Imaging Review Dg Lumbar Spine Complete  12/01/2014  CLINICAL DATA:  81 year old female acute lumbar spine pain following fall today. Initial encounter. EXAM: LUMBAR SPINE - COMPLETE 4+ VIEW COMPARISON:  None. FINDINGS: Five non rib-bearing lumbar type vertebra are identified. 6 mm anterolisthesis of L4 on L5 identified. There is no evidence of acute fracture. Mild multilevel degenerative disc disease and facet arthropathy noted. No focal bony lesions are present or spondylolysis noted. IMPRESSION: Grade 1 spondylolisthesis of L4 and L5 -likely chronic. No evidence of acute fracture. Mild multilevel degenerative changes throughout the lumbar spine. Electronically Signed   By: Harmon PierJeffrey  Hu M.D.   On: 12/01/2014 18:17   Dg Sacrum/coccyx  12/01/2014  CLINICAL DATA:  Status post fall, tail bone pain EXAM: SACRUM AND COCCYX - 2+ VIEW COMPARISON:  None. FINDINGS: There is no evidence of fracture or other focal bone lesions. There is abnormal articulation of the left L5 transverse process with the sacrum. There is degenerative disc disease at L4-5 and L5-S1. There is grade 1 anterolisthesis of L4 on L5 secondary to moderate facet arthropathy. There is generalized osteopenia. IMPRESSION: No acute osseous injury of the sacrum or coccyx. Electronically Signed   By: Elige KoHetal  Patel   On: 12/01/2014 18:15   Dg Hip Unilat With Pelvis 2-3 Views Right  12/01/2014  CLINICAL DATA:  Fall today with right hip pain. EXAM: DG HIP (WITH OR WITHOUT PELVIS) 2-3V RIGHT COMPARISON:  None. FINDINGS: Femoral heads are located. Sacroiliac joints are symmetric. No acute fracture. IMPRESSION: No  acute osseous abnormality. Electronically Signed   By: Jeronimo GreavesKyle  Talbot M.D.   On: 12/01/2014 18:18   I have personally reviewed and evaluated these images and lab results as part of my medical decision-making.   EKG Interpretation None      MDM   Final diagnoses:  Fall, initial encounter    Patient seen for pain and lower back and tailbone after mechanical fall earlier this evening. X-rays revealed no acute fracture. Neurologically patient is at baseline. Patient has right-sided weakness due to previous stroke.  Patient ambulated with a walker. Gait at patient's baseline. Patient stable for discharge.VSS.  Return precautions outlined in patient discharge instructions.   Patient was discussed with and seen by Dr. Patria Maneampos who agrees with the treatment plan.      Lester KinsmanSamantha Tripp [place], PA-C 12/01/14 2151  Azalia BilisKevin Campos, MD 12/02/14 [phone]

## 2014-12-01 NOTE — ED Notes (Signed)
Ambulated patient in room, patient very unsteady on feet and noted right sided weakness due to past injury.

## 2014-12-02 ENCOUNTER — Emergency Department (HOSPITAL_COMMUNITY)
Admission: EM | Admit: 2014-12-02 | Discharge: 2014-12-02 | Disposition: A | Discharge status: Home / Self Care | Payer: Medicare Other | Attending: Emergency Medicine | Admitting: Emergency Medicine

## 2014-12-02 ENCOUNTER — Emergency Department (HOSPITAL_COMMUNITY): Payer: Medicare Other

## 2014-12-02 ENCOUNTER — Encounter (HOSPITAL_COMMUNITY): Payer: Self-pay

## 2014-12-02 DIAGNOSIS — Z79899 Other long term (current) drug therapy: Secondary | ICD-10-CM | POA: Insufficient documentation

## 2014-12-02 DIAGNOSIS — Z8639 Personal history of other endocrine, nutritional and metabolic disease: Secondary | ICD-10-CM | POA: Insufficient documentation

## 2014-12-02 DIAGNOSIS — I10 Essential (primary) hypertension: Secondary | ICD-10-CM | POA: Diagnosis not present

## 2014-12-02 DIAGNOSIS — Z8673 Personal history of transient ischemic attack (TIA), and cerebral infarction without residual deficits: Secondary | ICD-10-CM | POA: Diagnosis not present

## 2014-12-02 DIAGNOSIS — Z9849 Cataract extraction status, unspecified eye: Secondary | ICD-10-CM | POA: Insufficient documentation

## 2014-12-02 DIAGNOSIS — F329 Major depressive disorder, single episode, unspecified: Secondary | ICD-10-CM | POA: Insufficient documentation

## 2014-12-02 DIAGNOSIS — R079 Chest pain, unspecified: Secondary | ICD-10-CM | POA: Insufficient documentation

## 2014-12-02 LAB — CBC WITH DIFFERENTIAL/PLATELET
Basophils Absolute: 0 10*3/uL (ref 0.0–0.1)
Basophils Relative: 1 %
Eosinophils Absolute: 0.1 10*3/uL (ref 0.0–0.7)
Eosinophils Relative: 2 %
HCT: 38.3 % (ref 36.0–46.0)
Hemoglobin: 12.7 g/dL (ref 12.0–15.0)
Lymphocytes Relative: 52 %
Lymphs Abs: 3.1 10*3/uL (ref 0.7–4.0)
MCH: 29.8 pg (ref 26.0–34.0)
MCHC: 33.2 g/dL (ref 30.0–36.0)
MCV: 89.9 fL (ref 78.0–100.0)
Monocytes Absolute: 0.4 10*3/uL (ref 0.1–1.0)
Monocytes Relative: 7 %
Neutro Abs: 2.2 10*3/uL (ref 1.7–7.7)
Neutrophils Relative %: 38 %
Platelets: 229 10*3/uL (ref 150–400)
RBC: 4.26 MIL/uL (ref 3.87–5.11)
RDW: 13.8 % (ref 11.5–15.5)
WBC: 5.9 10*3/uL (ref 4.0–10.5)

## 2014-12-02 LAB — BASIC METABOLIC PANEL WITH GFR
Anion gap: 7 (ref 5–15)
BUN: 15 mg/dL (ref 6–20)
CO2: 25 mmol/L (ref 22–32)
Calcium: 9.3 mg/dL (ref 8.9–10.3)
Chloride: 98 mmol/L — ABNORMAL LOW (ref 101–111)
Creatinine, Ser: 0.93 mg/dL (ref 0.44–1.00)
GFR calc Af Amer: >60 mL/min
GFR calc non Af Amer: 57 mL/min — ABNORMAL LOW
Glucose, Bld: 112 mg/dL — ABNORMAL HIGH (ref 65–99)
Potassium: 4.4 mmol/L (ref 3.5–5.1)
Sodium: 130 mmol/L — ABNORMAL LOW (ref 135–145)

## 2014-12-02 LAB — I-STAT TROPONIN, ED
Troponin i, poc: 0.01 ng/mL (ref 0.00–0.08)
Troponin i, poc: 0 ng/mL (ref 0.00–0.08)

## 2014-12-02 NOTE — ED Notes (Signed)
MD at bedside. 

## 2014-12-02 NOTE — ED Provider Notes (Signed)
CSN: [id number]     Arrival date & time 12/02/14  0402 History   First MD Initiated Contact with Patient 12/02/14 0403     Chief Complaint  Patient presents with  . Chest Pain     (Consider location/radiation/quality/duration/timing/severity/associated sxs/prior Treatment) HPI  Carrie Riley is a 81 y.o. female with past medical history of hypertension, hyperlipidemia, stroke with right-sided weakness presenting today with chest pain.  She states this occurred tonight when she was laying down to go to sleep. She describes it as a burning pain that is midepigastric.  She denies any associated symptoms, there was no shortness of breath, vomiting, or diaphoresis. There is no radiation to the pain. She denies ever having this in the past, she thinks it's related to taking her Norco for the first time this evening. She states it lasted approximately 30 seconds and resolved on its own. Patient denies any history of heart attack or blood clots. She has no further complaints.  10 Systems reviewed and are negative for acute change except as noted in the HPI.      Past Medical History  Diagnosis Date  . Cataract   . Hypertension   . Depression   . Hyperlipidemia   . Stroke [facility])    Past Surgical History  Procedure Laterality Date  . Tubal ligation    . Hernia repair      hiatal, years ago  . Cataract extraction Right 10/2013  . Tonsillectomy    . Iud removed      due to infection   Family History  Problem Relation Age of Onset  . Diabetes Mother   . Diabetes Father   . Alzheimer's disease Father    Social History  Substance Use Topics  . Smoking status: Never Smoker   . Smokeless tobacco: None  . Alcohol Use: No   OB History    No data available     Review of Systems    Allergies  Ciprofloxacin and Citric acid  Home Medications   Prior to Admission medications   Medication Sig Start Date End Date Taking? Authorizing Provider  amLODipine (NORVASC) 5 MG tablet Take 5  mg by mouth daily.    Historical Provider, MD  Celery Seed OIL Take 120 mLs by mouth 4 (four) times daily.    Historical Provider, MD  Ginkgo Biloba (GINKOBA PO) Take 1 tablet by mouth daily.     Historical Provider, MD  HYDROcodone-acetaminophen (NORCO/VICODIN) 5-325 MG tablet Take 2 tablets by mouth every 4 (four) hours as needed. 12/01/14   Samantha Tripp Dowless, PA-C  losartan (COZAAR) 100 MG tablet Take 100 mg by mouth daily.    Historical Provider, MD  magnesium 30 MG tablet Take 30 mg by mouth 2 (two) times daily.     Historical Provider, MD  Misc Natural Products (TURMERIC CURCUMIN) CAPS Take 1 capsule by mouth 2 (two) times daily.    Historical Provider, MD  sertraline (ZOLOFT) 25 MG tablet Take 1 tablet (25 mg total) by mouth daily. 07/30/13   [name], MD   BP 139/59 mmHg  Pulse 80  Temp(Src) 98.4 F (36.9 C) (Oral)  Resp 14  SpO2 97% Physical Exam  Constitutional: She is oriented to person, place, and time. She appears well-developed and well-nourished. No distress.  HENT:  Head: Normocephalic and atraumatic.  Nose: Nose normal.  Mouth/Throat: Oropharynx is clear and moist. No oropharyngeal exudate.  Eyes: Conjunctivae and EOM are normal. Pupils are equal, round, and reactive to  light. No scleral icterus.  Neck: Normal range of motion. Neck supple. No JVD present. No tracheal deviation present. No thyromegaly present.  Cardiovascular: Normal rate, regular rhythm and normal heart sounds.  Exam reveals no gallop and no friction rub.   No murmur heard. Pulmonary/Chest: Effort normal and breath sounds normal. No respiratory distress. She has no wheezes. She exhibits no tenderness.  Abdominal: Soft. Bowel sounds are normal. She exhibits no distension and no mass. There is no tenderness. There is no rebound and no guarding.  Musculoskeletal: Normal range of motion. She exhibits edema. She exhibits no tenderness.  Lymphadenopathy:    She has no cervical adenopathy.   Neurological: She is alert and oriented to person, place, and time. No cranial nerve deficit. She exhibits normal muscle tone.  Skin: Skin is warm and dry. No rash noted. No erythema. No pallor.  Nursing note and vitals reviewed.   ED Course  Procedures (including critical care time) Labs Review Labs Reviewed  CBC WITH DIFFERENTIAL/PLATELET  BASIC METABOLIC PANEL  I-STAT TROPOININ, ED    Imaging Review Dg Lumbar Spine Complete  12/01/2014  CLINICAL DATA:  82 year old female acute lumbar spine pain following fall today. Initial encounter. EXAM: LUMBAR SPINE - COMPLETE 4+ VIEW COMPARISON:  None. FINDINGS: Five non rib-bearing lumbar type vertebra are identified. 6 mm anterolisthesis of L4 on L5 identified. There is no evidence of acute fracture. Mild multilevel degenerative disc disease and facet arthropathy noted. No focal bony lesions are present or spondylolysis noted. IMPRESSION: Grade 1 spondylolisthesis of L4 and L5 -likely chronic. No evidence of acute fracture. Mild multilevel degenerative changes throughout the lumbar spine. Electronically Signed   By: Harmon Pier M.D.   On: 12/01/2014 18:17   Dg Sacrum/coccyx  12/01/2014  CLINICAL DATA:  Status post fall, tail bone pain EXAM: SACRUM AND COCCYX - 2+ VIEW COMPARISON:  None. FINDINGS: There is no evidence of fracture or other focal bone lesions. There is abnormal articulation of the left L5 transverse process with the sacrum. There is degenerative disc disease at L4-5 and L5-S1. There is grade 1 anterolisthesis of L4 on L5 secondary to moderate facet arthropathy. There is generalized osteopenia. IMPRESSION: No acute osseous injury of the sacrum or coccyx. Electronically Signed   By: Elige Ko   On: 12/01/2014 18:15   Dg Hip Unilat With Pelvis 2-3 Views Right  12/01/2014  CLINICAL DATA:  Fall today with right hip pain. EXAM: DG HIP (WITH OR WITHOUT PELVIS) 2-3V RIGHT COMPARISON:  None. FINDINGS: Femoral heads are located. Sacroiliac  joints are symmetric. No acute fracture. IMPRESSION: No acute osseous abnormality. Electronically Signed   By: Jeronimo Greaves M.D.   On: 12/01/2014 18:18   I have personally reviewed and evaluated these images and lab results as part of my medical decision-making.   EKG Interpretation   Date/Time:  Saturday December 02 2014 04:07:09 EDT Ventricular Rate:  84 PR Interval:  144 QRS Duration: 87 QT Interval:  380 QTC Calculation: 449 R Axis:   52 Text Interpretation:  Sinus rhythm Probable left atrial enlargement  Borderline ST elevation, anterior leads No old tracing to compare  Confirmed by Erroll Luna [id number]) on 12/02/2014 4:41:25 AM      MDM   Final diagnoses:  None   Patient presents to the emergency department for chest pain. Her history is not consistent with ACS. Will evaluate in the emergency department with heart score and 2 sets of troponins. Initial heart score is 3, for age  she gets 2 points and for risk factors she gets 1 point.  EKG does not show any acute signs of ischemia. We'll continue to monitor.  Repeat troponin and EKG continue to be normal. Heart score remains Feig. At this time patient has had no recurrence of her chest pain. She appears well-developed and acute distress, vital signs remain within her normal limits and she is safe for discharge.   Tomasita CrumbleAdeleke Deanda Ruddell, MD 12/02/14 [phone]

## 2014-12-02 NOTE — ED Notes (Signed)
Per EMS pt had a fall a few days ago and was seen and treated her; pt given Narcotic for pain; Pt reports she took pain med and central chest pain started while lying in bed that lasted about 10 minutes; pt has hx of stroke with right sided weakness and slurred speech; Pt denies any Sob, n/v or diaphoresis at onset of chest pain; Pt denies chest pain on arrival to ED; Md at bedside on arrival; Pt a&ox 4 on arrival

## 2014-12-02 NOTE — Discharge Instructions (Signed)
Nonspecific Chest Pain Carrie Riley, your chest x-ray, blood work, EKG today were normal. See a primary care physician within 3 days for close follow-up of your chest pain. If any symptoms worsen come back to the emergency department immediately. Thank you. It is often hard to find the cause of chest pain. There is always a chance that your pain could be related to something serious, such as a heart attack or a blood clot in your lungs. Chest pain can also be caused by conditions that are not life-threatening. If you have chest pain, it is very important to follow up with your doctor.  HOME CARE  If you were prescribed an antibiotic medicine, finish it all even if you start to feel better.  Avoid any activities that cause chest pain.  Do not use any tobacco products, including cigarettes, chewing tobacco, or electronic cigarettes. If you need help quitting, ask your doctor.  Do not drink alcohol.  Take medicines only as told by your doctor.  Keep all follow-up visits as told by your doctor. This is important. This includes any further testing if your chest pain does not go away.  Your doctor may tell you to keep your head raised (elevated) while you sleep.  Make lifestyle changes as told by your doctor. These may include:  Getting regular exercise. Ask your doctor to suggest some activities that are safe for you.  Eating a heart-healthy diet. Your doctor or a diet specialist (dietitian) can help you to learn healthy eating options.  Maintaining a healthy weight.  Managing diabetes, if necessary.  Reducing stress. GET HELP IF:  Your chest pain does not go away, even after treatment.  You have a rash with blisters on your chest.  You have a fever. GET HELP RIGHT AWAY IF:  Your chest pain is worse.  You have an increasing cough, or you cough up blood.  You have severe belly (abdominal) pain.  You feel extremely weak.  You pass out (faint).  You have chills.  You have  sudden, unexplained chest discomfort.  You have sudden, unexplained discomfort in your arms, back, neck, or jaw.  You have shortness of breath at any time.  You suddenly start to sweat, or your skin gets clammy.  You feel nauseous.  You vomit.  You suddenly feel light-headed or dizzy.  Your heart begins to beat quickly, or it feels like it is skipping beats. These symptoms may be an emergency. Do not wait to see if the symptoms will go away. Get medical help right away. Call your local emergency services (911 in the [place] Do not drive yourself to the hospital.   This information is not intended to replace advice given to you by your health care provider. Make sure you discuss any questions you have with your health care provider.   Document Released: 07/23/2007 Document Revised: 02/24/2014 Document Reviewed: 09/09/2013 Elsevier Interactive Patient Education [employer]2016 Elsevier Inc.

## 2014-12-11 ENCOUNTER — Ambulatory Visit (INDEPENDENT_AMBULATORY_CARE_PROVIDER_SITE_OTHER): Payer: Medicare Other | Admitting: Family Medicine

## 2014-12-11 VITALS — BP 140/76 | HR 103 | Temp 98.3°F | Resp 20 | Ht 65.0 in | Wt 130.0 lb

## 2014-12-11 DIAGNOSIS — S60022A Contusion of left index finger without damage to nail, initial encounter: Secondary | ICD-10-CM

## 2014-12-11 DIAGNOSIS — J209 Acute bronchitis, unspecified: Secondary | ICD-10-CM | POA: Diagnosis not present

## 2014-12-11 DIAGNOSIS — M1712 Unilateral primary osteoarthritis, left knee: Secondary | ICD-10-CM | POA: Diagnosis not present

## 2014-12-11 DIAGNOSIS — S6010XA Contusion of unspecified finger with damage to nail, initial encounter: Secondary | ICD-10-CM

## 2014-12-11 MED ORDER — HYDROCODONE-HOMATROPINE 5-1.5 MG/5ML PO SYRP: 5.0000 mL | ORAL_SOLUTION | Freq: Three times a day (TID) | ORAL | Status: DC | PRN

## 2014-12-11 MED ORDER — CELECOXIB 100 MG PO CAPS: 100.0000 mg | ORAL_CAPSULE | Freq: Two times a day (BID) | ORAL | Status: DC

## 2014-12-11 MED ORDER — AZITHROMYCIN 250 MG PO TABS: ORAL_TABLET | ORAL | Status: DC

## 2014-12-11 NOTE — Patient Instructions (Signed)

## 2014-12-11 NOTE — Progress Notes (Signed)
   Subjective:    Patient ID: Carrie Riley, female    DOB: April 23, 1932, 81 y.o.   MRN: [id number] This chart was scribed for Carrie SidleKurt Merelyn Klump, MD by Littie Deedsichard Sun, Medical Scribe. This patient was seen in Room 5 and the patient's care was started at 6:50 PM.   HPI HPI Comments: Leland JohnsDorita Eidem is a 81 y.o. female with a history of CVA (right side, few months ago) who presents to the [facility] complaining of gradual onset, productive cough of phlegm that started a few days ago. She has also had some difficulty sleeping. Patient denies difficulty swallowing.  Patient also fell 10 days ago and reports having associated pain in the right side her buttocks. She is wheelchair bound and is able to stand up with assistance. She was seen in the ED for this and did have some XR imaging.  She also has some left 2nd finger pain from a crush injury a few months ago.  She also reports having some persistent left knee pain.  She is currently looking to establish with a PCP.  Review of Systems  HENT: Negative for trouble swallowing.   Respiratory: Positive for cough.   Musculoskeletal: Positive for myalgias and arthralgias.  Psychiatric/Behavioral: Positive for sleep disturbance.       Objective:   Physical Exam CONSTITUTIONAL: Well developed/well nourished HEAD: Normocephalic/atraumatic EYES: EOM/PERRL ENMT: Mucous membranes moist NECK: supple no meningeal signs SPINE: entire spine nontender CV: S1/S2 noted, no murmurs/rubs/gallops noted LUNGS: Lungs show rhonchi bilaterally, no apparent distress ABDOMEN: soft, nontender, no rebound or guarding GU: no cva tenderness NEURO: Pt is awake/alert; patient can only stand with assistance of 2 people EXTREMITIES: pulses normal SKIN: warm, color normal. Old subungual hematoma on her left index finger Patient has diffuse synovial thickening about effusion over both knees. She has 1+ pedal edema Examination of ischium on right shows  tenderness but no skin break, bony abnormality, or soft tissue swelling, or ecchymosis PSYCH: no abnormalities of mood noted        Assessment & Plan:   By signing my name below, I, Littie Deedsichard Sun, attest that this documentation has been prepared under the direction and in the presence of Carrie SidleKurt Khyrie Masi, MD.  Electronically Signed: Littie Deedsichard Sun, Medical Scribe. 12/11/2014. 6:50 PM.  This chart was scribed in my presence and reviewed by me personally.    ICD-9-CM ICD-10-CM   1. Acute bronchitis, unspecified organism 466.0 J20.9 HYDROcodone-homatropine (HYCODAN) 5-1.5 MG/5ML syrup     azithromycin (ZITHROMAX) 250 MG tablet  2. Primary osteoarthritis of left knee 715.16 M17.12 celecoxib (CELEBREX) 100 MG capsule  3. Subungual hematoma of digit of hand, initial encounter 923.3 S60.00XA      Signed, Carrie SidleKurt Stewart Pimenta, MD

## 2015-05-11 ENCOUNTER — Inpatient Hospital Stay (HOSPITAL_COMMUNITY)
Admission: EM | Admit: 2015-05-11 | Discharge: 2015-05-13 | DRG: 101 | Disposition: A | Discharge status: Home Health | Payer: Medicare Other | Attending: Family Medicine | Admitting: Family Medicine

## 2015-05-11 ENCOUNTER — Encounter (HOSPITAL_COMMUNITY): Payer: Self-pay

## 2015-05-11 ENCOUNTER — Emergency Department (HOSPITAL_COMMUNITY): Payer: Medicare Other

## 2015-05-11 DIAGNOSIS — G4089 Other seizures: Principal | ICD-10-CM | POA: Diagnosis present

## 2015-05-11 DIAGNOSIS — N3000 Acute cystitis without hematuria: Secondary | ICD-10-CM | POA: Diagnosis not present

## 2015-05-11 DIAGNOSIS — E785 Hyperlipidemia, unspecified: Secondary | ICD-10-CM | POA: Diagnosis present

## 2015-05-11 DIAGNOSIS — G9340 Encephalopathy, unspecified: Secondary | ICD-10-CM | POA: Diagnosis not present

## 2015-05-11 DIAGNOSIS — I10 Essential (primary) hypertension: Secondary | ICD-10-CM | POA: Diagnosis present

## 2015-05-11 DIAGNOSIS — R8271 Bacteriuria: Secondary | ICD-10-CM | POA: Diagnosis present

## 2015-05-11 DIAGNOSIS — R4182 Altered mental status, unspecified: Secondary | ICD-10-CM | POA: Diagnosis present

## 2015-05-11 DIAGNOSIS — N390 Urinary tract infection, site not specified: Secondary | ICD-10-CM | POA: Diagnosis present

## 2015-05-11 DIAGNOSIS — R402433 Glasgow coma scale score 3-8, at hospital admission: Secondary | ICD-10-CM | POA: Diagnosis not present

## 2015-05-11 DIAGNOSIS — I69351 Hemiplegia and hemiparesis following cerebral infarction affecting right dominant side: Secondary | ICD-10-CM

## 2015-05-11 DIAGNOSIS — Z9181 History of falling: Secondary | ICD-10-CM | POA: Diagnosis not present

## 2015-05-11 DIAGNOSIS — I69320 Aphasia following cerebral infarction: Secondary | ICD-10-CM

## 2015-05-11 LAB — URINALYSIS, ROUTINE W REFLEX MICROSCOPIC
Glucose, UA: NEGATIVE mg/dL
HGB URINE DIPSTICK: NEGATIVE
Ketones, ur: NEGATIVE mg/dL
NITRITE: NEGATIVE
PROTEIN: NEGATIVE mg/dL
Specific Gravity, Urine: 1.02 (ref 1.005–1.030)
pH: 6 (ref 5.0–8.0)

## 2015-05-11 LAB — CBC WITH DIFFERENTIAL/PLATELET
BASOS ABS: 0 10*3/uL (ref 0.0–0.1)
Basophils Relative: 0 %
EOS ABS: 0.1 10*3/uL (ref 0.0–0.7)
EOS PCT: 1 %
HCT: 35.6 % — ABNORMAL LOW (ref 36.0–46.0)
Hemoglobin: 11.3 g/dL — ABNORMAL LOW (ref 12.0–15.0)
Lymphocytes Relative: 48 %
Lymphs Abs: 2.7 10*3/uL (ref 0.7–4.0)
MCH: 29.8 pg (ref 26.0–34.0)
MCHC: 31.7 g/dL (ref 30.0–36.0)
MCV: 93.9 fL (ref 78.0–100.0)
Monocytes Absolute: 0.3 10*3/uL (ref 0.1–1.0)
Monocytes Relative: 5 %
NEUTROS PCT: 46 %
Neutro Abs: 2.6 10*3/uL (ref 1.7–7.7)
Platelets: 232 10*3/uL (ref 150–400)
RBC: 3.79 MIL/uL — ABNORMAL LOW (ref 3.87–5.11)
RDW: 15.2 % (ref 11.5–15.5)
WBC: 5.7 10*3/uL (ref 4.0–10.5)

## 2015-05-11 LAB — COMPREHENSIVE METABOLIC PANEL
ALT: 12 U/L — ABNORMAL LOW (ref 14–54)
AST: 18 U/L (ref 15–41)
Albumin: 2.9 g/dL — ABNORMAL LOW (ref 3.5–5.0)
Alkaline Phosphatase: 46 U/L (ref 38–126)
Anion gap: 9 (ref 5–15)
BILIRUBIN TOTAL: 0.6 mg/dL (ref 0.3–1.2)
BUN: 19 mg/dL (ref 6–20)
CO2: 25 mmol/L (ref 22–32)
Calcium: 9.4 mg/dL (ref 8.9–10.3)
Chloride: 109 mmol/L (ref 101–111)
Creatinine, Ser: 0.76 mg/dL (ref 0.44–1.00)
Glucose, Bld: 86 mg/dL (ref 65–99)
POTASSIUM: 3.5 mmol/L (ref 3.5–5.1)
Sodium: 143 mmol/L (ref 135–145)
TOTAL PROTEIN: 5.9 g/dL — AB (ref 6.5–8.1)

## 2015-05-11 LAB — URINE MICROSCOPIC-ADD ON

## 2015-05-11 LAB — TROPONIN I

## 2015-05-11 MED ORDER — SODIUM CHLORIDE 0.9 % IV BOLUS (SEPSIS)
500.0000 mL | Freq: Once | INTRAVENOUS | Status: AC
  Administered 2015-05-11: 500 mL via INTRAVENOUS

## 2015-05-11 MED ORDER — DEXTROSE 5 % IV SOLN
1.0000 g | Freq: Once | INTRAVENOUS | Status: AC
  Administered 2015-05-11: 1 g via INTRAVENOUS
  Filled 2015-05-11: qty 10

## 2015-05-11 NOTE — ED Notes (Addendum)
Pt arrives EMS with c/o unable to wake patient. Pt has hx of CVA with right sided weakness and is non verbal. Pt arrives with eyes open and tracking movement and follows some commands.

## 2015-05-11 NOTE — ED Provider Notes (Signed)
CSN: [id number]     Arrival date & time 05/11/15  1855 History   First MD Initiated Contact with Patient 05/11/15 1928     Chief Complaint  Patient presents with  . Altered Mental Status   Level V caveat due to nonverbal status. HPI Patient was brought in for altered mental status. Reportedly had around 2 hours today where she was unresponsive. She was breathing. Husband said it was like she was in a very deep sleep but he was not able to wake her up. She's had a previous large stroke and does not move her right side and is nonverbal. She will communicate by using a letter board with her left hand. Reported as have more weakness on her left side. Patient cannot provide much history.   Past Medical History  Diagnosis Date  . Cataract   . Hypertension   . Depression   . Hyperlipidemia   . Stroke [facility]    Past Surgical History  Procedure Laterality Date  . Tubal ligation    . Hernia repair      hiatal, years ago  . Cataract extraction Right 10/2013  . Tonsillectomy    . Iud removed      due to infection   Family History  Problem Relation Age of Onset  . Diabetes Mother   . Diabetes Father   . Alzheimer's disease Father    Social History  Substance Use Topics  . Smoking status: Never Smoker   . Smokeless tobacco: Never Used  . Alcohol Use: No   OB History    No data available     Review of Systems  Unable to perform ROS: Patient nonverbal      Allergies  Ciprofloxacin and Citric acid  Home Medications   Prior to Admission medications   Medication Sig Start Date End Date Taking? Authorizing Provider  amLODipine (NORVASC) 5 MG tablet Take 5 mg by mouth daily.    Historical Provider, MD  azithromycin (ZITHROMAX) 250 MG tablet Take 2 tabs PO x 1 dose, then 1 tab PO QD x 4 days 12/11/14   Elvina SidleKurt Lauenstein, MD  celecoxib (CELEBREX) 100 MG capsule Take 1 capsule (100 mg total) by mouth 2 (two) times daily. 12/11/14   Elvina SidleKurt Lauenstein, MD  Celery Seed OIL Take 120 mLs by  mouth 4 (four) times daily.    Historical Provider, MD  Ginkgo Biloba (GINKOBA PO) Take 1 tablet by mouth daily.     Historical Provider, MD  HYDROcodone-acetaminophen (NORCO/VICODIN) 5-325 MG tablet Take 2 tablets by mouth every 4 (four) hours as needed. 12/01/14   Samantha Tripp Dowless, PA-C  HYDROcodone-homatropine (HYCODAN) 5-1.5 MG/5ML syrup Take 5 mLs by mouth every 8 (eight) hours as needed for cough. 12/11/14   Elvina SidleKurt Lauenstein, MD  losartan (COZAAR) 100 MG tablet Take 100 mg by mouth daily.    Historical Provider, MD  magnesium 30 MG tablet Take 30 mg by mouth 2 (two) times daily.     Historical Provider, MD  Misc Natural Products (TURMERIC CURCUMIN) CAPS Take 1 capsule by mouth 2 (two) times daily.    Historical Provider, MD  sertraline (ZOLOFT) 25 MG tablet Take 1 tablet (25 mg total) by mouth daily. Patient not taking: Reported on 12/11/2014 07/30/13   Tonye Pearsonobert P Doolittle, MD   BP 94/54 mmHg  Pulse 86  Temp(Src) 98.1 F (36.7 C) (Oral)  Resp 13  Ht 5\' 5"  (1.651 m)  Wt 130 lb (58.968 kg)  BMI 21.63 kg/m2  SpO2  100% Physical Exam  Constitutional: She appears well-developed.  HENT:  Tongue is somewhat dry  Eyes: EOM are normal.  Cardiovascular: Normal rate.   Pulmonary/Chest: Effort normal.  Abdominal: There is no tenderness.  Musculoskeletal: She exhibits no tenderness.  Neurological: She is alert.  Patient will look to me and follow commands. Some right-sided facial droop. Chronically paralyzed on right. Able to squeeze hands on left side.  Skin: Skin is warm.  Vitals reviewed.   ED Course  Procedures (including critical care time) Labs Review Labs Reviewed  COMPREHENSIVE METABOLIC PANEL - Abnormal; Notable for the following:    Total Protein 5.9 (*)    Albumin 2.9 (*)    ALT 12 (*)    All other components within normal limits  URINALYSIS, ROUTINE W REFLEX MICROSCOPIC (NOT AT [facility]) - Abnormal; Notable for the following:    APPearance CLOUDY (*)    Bilirubin Urine  SMALL (*)    Leukocytes, UA SMALL (*)    All other components within normal limits  CBC WITH DIFFERENTIAL/PLATELET - Abnormal; Notable for the following:    RBC 3.79 (*)    Hemoglobin 11.3 (*)    HCT 35.6 (*)    All other components within normal limits  URINE MICROSCOPIC-ADD ON - Abnormal; Notable for the following:    Squamous Epithelial / LPF 0-5 (*)    Bacteria, UA MANY (*)    Crystals CA OXALATE CRYSTALS (*)    All other components within normal limits  URINE CULTURE  TROPONIN I    Imaging Review Ct Head Wo Contrast  05/11/2015  CLINICAL DATA:  Altered mental status. EXAM: CT HEAD WITHOUT CONTRAST TECHNIQUE: Contiguous axial images were obtained from the base of the skull through the vertex without intravenous contrast. COMPARISON:  None. FINDINGS: Ventricles are normal in size, for this patient's age, and normal in configuration. There are no parenchymal masses or mass effect. There is no evidence of a recent cortical infarct. Mild patchy white matter hypoattenuation is noted consistent with chronic microvascular ischemic change. There are no extra-axial masses or abnormal fluid collections. There is no intracranial hemorrhage. Sinuses and mastoid air cells are clear. IMPRESSION: 1. No acute intracranial abnormalities. 2. Age related volume loss. Mild chronic microvascular ischemic change. Electronically Signed   By: Amie Portland M.D.   On: 05/11/2015 20:52   Dg Chest Port 1 View  05/11/2015  CLINICAL DATA:  Altered mental status EXAM: PORTABLE CHEST 1 VIEW COMPARISON:  12/02/2014 FINDINGS: Cardiomediastinal silhouette is stable. No acute infiltrate or pleural effusion. No pulmonary edema. Bony thorax is unremarkable. IMPRESSION: No active disease. Electronically Signed   By: Natasha Mead M.D.   On: 05/11/2015 20:07   I have personally reviewed and evaluated these images and lab results as part of my medical decision-making.   EKG Interpretation   Date/Time:  Friday May 11 2015  19:26:01 EDT Ventricular Rate:  87 PR Interval:  129 QRS Duration: 85 QT Interval:  366 QTC Calculation: 440 R Axis:   34 Text Interpretation:  Sinus rhythm Probable left atrial enlargement  Borderline ST elevation, anterior leads No significant change since last  tracing Confirmed by Rubin Payor  MD, Harrold Donath [phone]) on 05/11/2015 7:29:01 PM      MDM   Final diagnoses:  Urinary tract infection without hematuria, site unspecified  Acute encephalopathy    Patient with altered mental status. Had sleeping episode. Previous stroke with Severe deficits. Has had decreased mental status also. Has apparent UTI. No inpatient stay or  nursing home stay in the last 60 days. Will treat with Rocephin and send culture. Admit to internal medicine. Patient's family states that she has not been able to take pills for the last day.   Benjiman Core, MD 05/11/15 [phone]

## 2015-05-11 NOTE — ED Notes (Signed)
Patient transported to CT 

## 2015-05-12 ENCOUNTER — Inpatient Hospital Stay (HOSPITAL_COMMUNITY): Payer: Medicare Other

## 2015-05-12 DIAGNOSIS — R402433 Glasgow coma scale score 3-8, at hospital admission: Secondary | ICD-10-CM

## 2015-05-12 DIAGNOSIS — G9340 Encephalopathy, unspecified: Secondary | ICD-10-CM

## 2015-05-12 DIAGNOSIS — R400 Somnolence: Secondary | ICD-10-CM

## 2015-05-12 DIAGNOSIS — R4182 Altered mental status, unspecified: Secondary | ICD-10-CM

## 2015-05-12 DIAGNOSIS — N3000 Acute cystitis without hematuria: Secondary | ICD-10-CM

## 2015-05-12 LAB — BASIC METABOLIC PANEL
ANION GAP: 8 (ref 5–15)
BUN: 19 mg/dL (ref 6–20)
CALCIUM: 9.2 mg/dL (ref 8.9–10.3)
CHLORIDE: 112 mmol/L — AB (ref 101–111)
CO2: 25 mmol/L (ref 22–32)
CREATININE: 0.69 mg/dL (ref 0.44–1.00)
GFR calc non Af Amer: >60 mL/min (ref >60)
Glucose, Bld: 86 mg/dL (ref 65–99)
Potassium: 3.7 mmol/L (ref 3.5–5.1)
SODIUM: 145 mmol/L (ref 135–145)

## 2015-05-12 LAB — CBC
HEMATOCRIT: 34.1 % — AB (ref 36.0–46.0)
HEMOGLOBIN: 11.2 g/dL — AB (ref 12.0–15.0)
MCH: 31 pg (ref 26.0–34.0)
MCHC: 32.8 g/dL (ref 30.0–36.0)
MCV: 94.5 fL (ref 78.0–100.0)
Platelets: 218 10*3/uL (ref 150–400)
RBC: 3.61 MIL/uL — ABNORMAL LOW (ref 3.87–5.11)
RDW: 15.2 % (ref 11.5–15.5)
WBC: 5.4 10*3/uL (ref 4.0–10.5)

## 2015-05-12 MED ORDER — HYDROMORPHONE HCL 1 MG/ML IJ SOLN: 1.0000 mg | Freq: Four times a day (QID) | INTRAMUSCULAR | Status: DC | PRN

## 2015-05-12 MED ORDER — ENOXAPARIN SODIUM 40 MG/0.4ML ~~LOC~~ SOLN
40.0000 mg | SUBCUTANEOUS | Status: DC
  Administered 2015-05-12: 40 mg via SUBCUTANEOUS
  Filled 2015-05-12 (×2): qty 0.4

## 2015-05-12 MED ORDER — CETYLPYRIDINIUM CHLORIDE 0.05 % MT LIQD
7.0000 mL | Freq: Two times a day (BID) | OROMUCOSAL | Status: DC
  Administered 2015-05-12 – 2015-05-13 (×2): 7 mL via OROMUCOSAL

## 2015-05-12 MED ORDER — SODIUM CHLORIDE 0.9 % IV SOLN
INTRAVENOUS | Status: DC
  Administered 2015-05-12: 75 mL/h via INTRAVENOUS
  Administered 2015-05-12: 13:00:00 via INTRAVENOUS

## 2015-05-12 MED ORDER — SODIUM CHLORIDE 0.9 % IV SOLN
1000.0000 mg | Freq: Once | INTRAVENOUS | Status: AC
Start: 2015-05-12 — End: 2015-05-12
  Administered 2015-05-12: 1000 mg via INTRAVENOUS
  Filled 2015-05-12: qty 10

## 2015-05-12 MED ORDER — DEXTROSE 5 % IV SOLN
1.0000 g | INTRAVENOUS | Status: DC
  Administered 2015-05-12: 1 g via INTRAVENOUS
  Filled 2015-05-12 (×2): qty 10

## 2015-05-12 MED ORDER — ASPIRIN 300 MG RE SUPP
300.0000 mg | Freq: Every day | RECTAL | Status: DC
  Administered 2015-05-12 – 2015-05-13 (×2): 300 mg via RECTAL
  Filled 2015-05-12 (×2): qty 1

## 2015-05-12 MED ORDER — SODIUM CHLORIDE 0.9 % IV SOLN
500.0000 mg | Freq: Two times a day (BID) | INTRAVENOUS | Status: DC
  Administered 2015-05-12 – 2015-05-13 (×2): 500 mg via INTRAVENOUS
  Filled 2015-05-12 (×4): qty 5

## 2015-05-12 NOTE — H&P (Signed)
[facility] History and Physical  Emmanuel Lantigua WUJ:[id number] DOB: Nov 20, 1932 DOA: 05/11/2015  Referring physician: EDP PCP: No PCP Per Patient   Chief Complaint: AMS   HPI: Carrie Riley is a 82 y.o. female with h/o stroke with residual R sided deficits and aphasia, communicates by using a letter board with left hand.  Patient is brought in by husband with 2 hour episode of complete unresponsiveness.  She was breathing but it was "like she was in a very deep sleep" and couldn't be aroused.  In the ED she was at baseline and following commands for EDP.  Work up demonstrated mild UTI, rocephin started and hospitalist asked to admit.  Unfortunatly when I entered the room, patient is back to now being minimally responsive.  Per husband there was no seizure like activity.  Review of Systems: Unable to perform due to AMS.  Past Medical History  Diagnosis Date  . Cataract   . Hypertension   . Depression   . Hyperlipidemia   . Stroke [facility])    Past Surgical History  Procedure Laterality Date  . Tubal ligation    . Hernia repair      hiatal, years ago  . Cataract extraction Right 10/2013  . Tonsillectomy    . Iud removed      due to infection   Social History:  reports that she has never smoked. She has never used smokeless tobacco. She reports that she does not drink alcohol or use illicit drugs.  Allergies  Allergen Reactions  . Ciprofloxacin Rash    On bilateral arms  . Citric Acid Hives    Family History  Problem Relation Age of Onset  . Diabetes Mother   . Diabetes Father   . Alzheimer's disease Father      Prior to Admission medications   Medication Sig Start Date End Date Taking? Authorizing Provider  amLODipine (NORVASC) 5 MG tablet Take 5 mg by mouth daily.    Historical Provider, MD  azithromycin (ZITHROMAX) 250 MG tablet Take 2 tabs PO x 1 dose, then 1 tab PO QD x 4 days 12/11/14   Elvina Sidle, MD  celecoxib (CELEBREX) 100 MG capsule Take 1 capsule (100  mg total) by mouth 2 (two) times daily. 12/11/14   Elvina Sidle, MD  Celery Seed OIL Take 120 mLs by mouth 4 (four) times daily.    Historical Provider, MD  Ginkgo Biloba (GINKOBA PO) Take 1 tablet by mouth daily.     Historical Provider, MD  HYDROcodone-acetaminophen (NORCO/VICODIN) 5-325 MG tablet Take 2 tablets by mouth every 4 (four) hours as needed. 12/01/14   Samantha Tripp Dowless, PA-C  HYDROcodone-homatropine (HYCODAN) 5-1.5 MG/5ML syrup Take 5 mLs by mouth every 8 (eight) hours as needed for cough. 12/11/14   Elvina Sidle, MD  losartan (COZAAR) 100 MG tablet Take 100 mg by mouth daily.    Historical Provider, MD  magnesium 30 MG tablet Take 30 mg by mouth 2 (two) times daily.     Historical Provider, MD  Misc Natural Products (TURMERIC CURCUMIN) CAPS Take 1 capsule by mouth 2 (two) times daily.    Historical Provider, MD  sertraline (ZOLOFT) 25 MG tablet Take 1 tablet (25 mg total) by mouth daily. Patient not taking: Reported on 12/11/2014 07/30/13   [name], MD   Physical Exam: Filed Vitals:   05/11/15 2300 05/11/15 2315  BP: 101/58 102/58  Pulse: 83 82  Temp:    Resp: 13 16    BP 102/58  mmHg  Pulse 82  Temp(Src) 98.1 F (36.7 C) (Oral)  Resp 16  Ht 5\' 5"  (1.651 m)  Wt 58.968 kg (130 lb)  BMI 21.63 kg/m2  SpO2 100%  General Appearance:    Lethargic, no distress, appears stated age  Head:    Normocephalic, atraumatic  Eyes:    PERRL, EOMI, sclera non-icteric        Nose:   Nares without drainage or epistaxis. Mucosa, turbinates normal  Throat:   Moist mucous membranes. Oropharynx without erythema or exudate.  Neck:   Supple. No carotid bruits.  No thyromegaly.  No lymphadenopathy.   Back:     No CVA tenderness, no spinal tenderness  Lungs:     Clear to auscultation bilaterally, without wheezes, rhonchi or rales  Chest wall:    No tenderness to palpitation  Heart:    Regular rate and rhythm without murmurs, gallops, rubs  Abdomen:     Soft,  non-tender, nondistended, normal bowel sounds, no organomegaly  Genitalia:    deferred  Rectal:    deferred  Extremities:   No clubbing, cyanosis or edema.  Pulses:   2+ and symmetric all extremities  Skin:   Skin color, texture, turgor normal, no rashes or lesions  Lymph nodes:   Cervical, supraclavicular, and axillary nodes normal  Neurologic:   Minimally responsive.    Labs on Admission:  Basic Metabolic Panel:  Recent Labs Lab 05/11/15 2143  NA 143  K 3.5  CL 109  CO2 25  GLUCOSE 86  BUN 19  CREATININE 0.76  CALCIUM 9.4   Liver Function Tests:  Recent Labs Lab 05/11/15 2143  AST 18  ALT 12*  ALKPHOS 46  BILITOT 0.6  PROT 5.9*  ALBUMIN 2.9*   No results for input(s): LIPASE, AMYLASE in the last 168 hours. No results for input(s): AMMONIA in the last 168 hours. CBC:  Recent Labs Lab 05/11/15 2143  WBC 5.7  NEUTROABS 2.6  HGB 11.3*  HCT 35.6*  MCV 93.9  PLT 232   Cardiac Enzymes:  Recent Labs Lab 05/11/15 2143  TROPONINI <0.03    BNP (last 3 results) No results for input(s): PROBNP in the last 8760 hours. CBG: No results for input(s): GLUCAP in the last 168 hours.  Radiological Exams on Admission: Ct Head Wo Contrast  05/11/2015  CLINICAL DATA:  Altered mental status. EXAM: CT HEAD WITHOUT CONTRAST TECHNIQUE: Contiguous axial images were obtained from the base of the skull through the vertex without intravenous contrast. COMPARISON:  None. FINDINGS: Ventricles are normal in size, for this patient's age, and normal in configuration. There are no parenchymal masses or mass effect. There is no evidence of a recent cortical infarct. Mild patchy white matter hypoattenuation is noted consistent with chronic microvascular ischemic change. There are no extra-axial masses or abnormal fluid collections. There is no intracranial hemorrhage. Sinuses and mastoid air cells are clear. IMPRESSION: 1. No acute intracranial abnormalities. 2. Age related volume loss.  Mild chronic microvascular ischemic change. Electronically Signed   By: Amie Portlandavid  Ormond M.D.   On: 05/11/2015 20:52   Dg Chest Port 1 View  05/11/2015  CLINICAL DATA:  Altered mental status EXAM: PORTABLE CHEST 1 VIEW COMPARISON:  12/02/2014 FINDINGS: Cardiomediastinal silhouette is stable. No acute infiltrate or pleural effusion. No pulmonary edema. Bony thorax is unremarkable. IMPRESSION: No active disease. Electronically Signed   By: Natasha MeadLiviu  Pop M.D.   On: 05/11/2015 20:07    EKG: Independently reviewed.  Assessment/Plan Principal Problem:  Altered mental status Active Problems:   UTI (urinary tract infection)   1. AMS - 1. Patient minimally responsive again 2. Dr. Roseanne Reno on way to evaluate 3. Patient is ~8 months out from the major stroke 4. CT head negative today 5. MRI ordered and pending 6. EEG 7. Loading with 1gm keppra 2. UTI - rocephin, cultures pending.    Code Status: Full  Family Communication: Husband at bedside Disposition Plan: Admit to inpatient   Time spent: 70 min  GARDNER, JARED M. [facility] Pager [phone]  If 7AM-7PM, please contact the day team taking care of the patient [email or web address] Password [facility] 05/12/2015, 12:33 AM

## 2015-05-12 NOTE — ED Notes (Signed)
Neurology at the bedside

## 2015-05-12 NOTE — Procedures (Signed)
   [facility]  EEG (ELECTROENCEPHALOGRAM) REPORT   STUDY DATE: 05/12/15 PATIENT NAME: Carrie JohnsDorita Vaughan DOB: [birth year] MRN: [id number]  ORDERING CLINICIAN: Richarda OverlieNayana Abrol, MD  TECHNOLOGIST: Marianne SofiaKelly Suitor TECHNIQUE: Electroencephalogram was recorded utilizing standard 10-20 system of lead placement and reformatted into average and bipolar montages.  RECORDING TIME: 20 minutes ACTIVATION: none  CLINICAL INFORMATION: 82 year old female with history of left MCA stroke, now with altered mental status. Evaluate for partial seizures.   FINDINGS: Background rhythms of 8-9 hertz and 20-25 microvolts, with intermixed 7-8 hertz and 5-6 hertz activity. No focal, lateralizing, epileptiform activity or seizures are seen. Patient recorded in the drowsy state.      IMPRESSION:  Abnormal EEG in the drowsy state demonstrating: 1. Mild-moderate slowing consistent with mild-moderate encephalopathy.  2. No epileptiform discharges or seizures are seen.     INTERPRETING PHYSICIAN:  Suanne MarkerVIKRAM R. Ileigh Mettler, MD Certified in Neurology, Neurophysiology and Neuroimaging  [facility] [address], [address] [place], [place] [address] [phone]

## 2015-05-12 NOTE — Evaluation (Addendum)
Clinical/Bedside Swallow Evaluation Patient Details  Name: Carrie Riley MRN: [id number] Date of Birth: December 09, 1932  Today's Date: 05/12/2015 Time: SLP Start Time (ACUTE ONLY): 1415 SLP Stop Time (ACUTE ONLY): 1443 SLP Time Calculation (min) (ACUTE ONLY): 28 min  Past Medical History:  Past Medical History  Diagnosis Date  . Cataract   . Hypertension   . Depression   . Hyperlipidemia   . Stroke [facility](H[facility])    Past Surgical History:  Past Surgical History  Procedure Laterality Date  . Tubal ligation    . Hernia repair      hiatal, years ago  . Cataract extraction Right 10/2013  . Tonsillectomy    . Iud removed      due to infection   HPI:  82 y.o. female with h/o stroke with residual R sided deficits and aphasia, communicates by using a letter board with left hand. Patient is brought in by husband with 2 hour episode of complete unresponsiveness. She was breathing but it was "like she was in a very deep sleep" and couldn't be aroused. In the ED she was at baseline and following commands for EDP;  MR head on 05/12/15 negative for acute process; CXR on 05/11/15 indicated no active disease.  Assessment / Plan / Recommendation Clinical Impression   Pt with overt s/s of aspiration with larger volumes of thin liquids/puree including multiple swallows, cup edge sips with right labial loss and multiple swallows; tsp amounts eliminated multiple swallows of thin/puree; solids with decreased cohesion/propulsion of bolus/impaired mastication notedrecommend thin via tsp/Dysphagia 1 diet initially to reduce or eliminate aspiration risk; educated husband re: aspiration precautions specifically with use of straw and larger volumes of liquids/puree and conservative diet to begin treatment regimen with diet upgrade with improved diet tolerance.  Aspiration Risk  Mild aspiration risk    Diet Recommendation   Dysphagia 1/thin via tsp only; no straws  Medication Administration: Crushed with puree    Other   Recommendations Oral Care Recommendations: Oral care BID   Follow up Recommendations  Skilled Nursing facility    Frequency and Duration min 2x/week  1 week       Prognosis Prognosis for Safe Diet Advancement: Good Barriers to Reach Goals: Severity of deficits      Swallow Study   General Date of Onset: 05/12/15 HPI: 82 y.o. female with h/o stroke with residual R sided deficits and aphasia, communicates by using a letter board with left hand. Patient is brought in by husband with 2 hour episode of complete unresponsiveness. She was breathing but it was "like she was in a very deep sleep" and couldn't be aroused. In the ED she was at baseline and following commands for EDP Type of Study: Bedside Swallow Evaluation Previous Swallow Assessment: SSS failed Diet Prior to this Study: NPO Temperature Spikes Noted: No Respiratory Status: Room air History of Recent Intubation: No Behavior/Cognition: Alert;Cooperative;Agitated Oral Cavity Assessment: Dry Oral Care Completed by SLP: Yes Oral Cavity - Dentition: Adequate natural dentition Vision: Impaired for self-feeding Self-Feeding Abilities: Needs assist;Needs set up Patient Positioning: Upright in bed Baseline Vocal Quality: Low vocal intensity;Other (comment) (difficult to determine d/t limited vocalizations) Volitional Cough: Weak Volitional Swallow: Able to elicit    Oral/Motor/Sensory Function Overall Oral Motor/Sensory Function: Mild impairment Facial Symmetry: Abnormal symmetry right Facial Strength: Reduced right Facial Sensation: Reduced right;Suspected CN V (Trigeminal) dysfunction Lingual ROM: Reduced right Lingual Symmetry: Abnormal symmetry right Lingual Strength: Within Functional Limits Lingual Sensation: Reduced Velum: Suspected CN X (Vagus) dysfunction Mandible: Within  Functional Limits   Ice Chips Ice chips: Not tested (Pt exhibits hypersensitivity to cold consistencies)   Thin Liquid Thin Liquid:  Impaired Presentation: Spoon;Straw Oral Phase Impairments: Reduced labial seal Oral Phase Functional Implications: Right anterior spillage Pharyngeal  Phase Impairments: Suspected delayed Swallow;Multiple swallows Other Comments:  (improved with smaller volumes (tsp))    Nectar Thick Nectar Thick Liquid: Not tested   Honey Thick Honey Thick Liquid: Not tested   Puree Puree: Impaired Presentation: Spoon Oral Phase Impairments: Reduced lingual movement/coordination Oral Phase Functional Implications: Prolonged oral transit Pharyngeal Phase Impairments: Suspected delayed Swallow;Multiple swallows Other Comments:  (small bites improved clearance)   Solid      Solid: Impaired Presentation: Spoon Oral Phase Impairments: Reduced lingual movement/coordination;Impaired mastication Oral Phase Functional Implications: Prolonged oral transit Pharyngeal Phase Impairments: Suspected delayed Swallow Other Comments:  (decreased cohesion)        Idella Lamontagne,PAT, M.S., CCC-SLP 05/12/2015,3:37 PM

## 2015-05-12 NOTE — Consult Note (Signed)
Admission H&P    Chief Complaint: Recurrent episodes of unresponsiveness.   HPI: Carrie Riley is an 82 y.o. female with a history of hypertension, hyperlipidemia, left MCA stroke about 8 months ago as aphasia and right hemiplegia, brought to the emergency room valuation of altered mental status with recurrent episodes of unresponsiveness. No tonic nor clonic activity has been observed. CT scan of the head showed no acute intracranial abnormality. Patient was afebrile. Laboratory studies showed findings indicative of possible urinary tract infection. Her husband indicates that she appears to be early weaker than at baseline.  Past Medical History  Diagnosis Date  . Cataract   . Hypertension   . Depression   . Hyperlipidemia   . Stroke [facility])     Past Surgical History  Procedure Laterality Date  . Tubal ligation    . Hernia repair      hiatal, years ago  . Cataract extraction Right 10/2013  . Tonsillectomy    . Iud removed      due to infection    Family History  Problem Relation Age of Onset  . Diabetes Mother   . Diabetes Father   . Alzheimer's disease Father    Social History:  reports that she has never smoked. She has never used smokeless tobacco. She reports that she does not drink alcohol or use illicit drugs.  Allergies:  Allergies  Allergen Reactions  . Ciprofloxacin Rash    On bilateral arms  . Citric Acid Hives    Locations: Patient's preadmission medications were reviewed by me.  ROS: Unavailable due to mental status changes.  Physical Examination: Blood pressure 134/77, pulse 82, temperature 98.1 F (36.7 C), temperature source Oral, resp. rate 11, height '5\' 5"'$  (1.651 m), weight 58.968 kg (130 lb), SpO2 99 %.  HEENT-  Normocephalic, no lesions, without obvious abnormality.  Normal external eye and conjunctiva.  Normal TM's bilaterally.  Normal auditory canals and external ears. Normal external nose, mucus membranes and septum.  Normal pharynx. Neck supple  with no masses, nodes, nodules or enlargement. Cardiovascular - regular rate and rhythm, S1, S2 normal, no murmur, click, rub or gallop Lungs - chest clear, no wheezing, rales, normal symmetric air entry Abdomen - soft, non-tender; bowel sounds normal; no masses,  no organomegaly Extremities - no joint deformities, effusion, or inflammation and no edema  Neurologic Examination: Patient was awake and in no acute distress. She responded to verbal stimulation with eye deviation toward sound. She was able to follow commands appropriately with use of her left upper extremity. She had no speech output. Right pupil was smaller than the left and did not react to light. Left pupil reacted normally to light. Extraocular movements were intact with right left lateral gaze. Mild to moderate right lower facial weakness was noted. Motor exam showed thrombosis of right upper extremity as well as strandy mid 80s with slight increase in tone throughout. He had movement of left upper extremity distally, but was unable to lift her arm against gravity. Mild extension contractures were noted at the ankles. Deep tendon reflexes were 2+ and symmetrical. Plantar responses were mute. Carotid auscultation was normal.  Results for orders placed or performed during the hospital encounter of 05/11/15 (from the past 48 hour(s))  Comprehensive metabolic panel     Status: Abnormal   Collection Time: 05/11/15  9:43 PM  Result Value Ref Range   Sodium 143 135 - 145 mmol/L   Potassium 3.5 3.5 - 5.1 mmol/L   Chloride 109 101 -  111 mmol/L   CO2 25 22 - 32 mmol/L   Glucose, Bld 86 65 - 99 mg/dL   BUN 19 6 - 20 mg/dL   Creatinine, Ser 0.76 0.44 - 1.00 mg/dL   Calcium 9.4 8.9 - 10.3 mg/dL   Total Protein 5.9 (L) 6.5 - 8.1 g/dL   Albumin 2.9 (L) 3.5 - 5.0 g/dL   AST 18 15 - 41 U/L   ALT 12 (L) 14 - 54 U/L   Alkaline Phosphatase 46 38 - 126 U/L   Total Bilirubin 0.6 0.3 - 1.2 mg/dL   GFR calc non Af Amer >60 >60 mL/min    GFR calc Af Amer >60 >60 mL/min    Comment: (NOTE) The eGFR has been calculated using the CKD EPI equation. This calculation has not been validated in all clinical situations. eGFR's persistently <60 mL/min signify possible Chronic Kidney Disease.    Anion gap 9 5 - 15  CBC with Differential     Status: Abnormal   Collection Time: 05/11/15  9:43 PM  Result Value Ref Range   WBC 5.7 4.0 - 10.5 K/uL   RBC 3.79 (L) 3.87 - 5.11 MIL/uL   Hemoglobin 11.3 (L) 12.0 - 15.0 g/dL   HCT 35.6 (L) 36.0 - 46.0 %   MCV 93.9 78.0 - 100.0 fL   MCH 29.8 26.0 - 34.0 pg   MCHC 31.7 30.0 - 36.0 g/dL   RDW 15.2 11.5 - 15.5 %   Platelets 232 150 - 400 K/uL   Neutrophils Relative % 46 %   Neutro Abs 2.6 1.7 - 7.7 K/uL   Lymphocytes Relative 48 %   Lymphs Abs 2.7 0.7 - 4.0 K/uL   Monocytes Relative 5 %   Monocytes Absolute 0.3 0.1 - 1.0 K/uL   Eosinophils Relative 1 %   Eosinophils Absolute 0.1 0.0 - 0.7 K/uL   Basophils Relative 0 %   Basophils Absolute 0.0 0.0 - 0.1 K/uL  Troponin I     Status: None   Collection Time: 05/11/15  9:43 PM  Result Value Ref Range   Troponin I <0.03 <0.031 ng/mL    Comment:        NO INDICATION OF MYOCARDIAL INJURY.   Urinalysis, Routine w reflex microscopic     Status: Abnormal   Collection Time: 05/11/15 10:33 PM  Result Value Ref Range   Color, Urine YELLOW YELLOW   APPearance CLOUDY (A) CLEAR   Specific Gravity, Urine 1.020 1.005 - 1.030   pH 6.0 5.0 - 8.0   Glucose, UA NEGATIVE NEGATIVE mg/dL   Hgb urine dipstick NEGATIVE NEGATIVE   Bilirubin Urine SMALL (A) NEGATIVE   Ketones, ur NEGATIVE NEGATIVE mg/dL   Protein, ur NEGATIVE NEGATIVE mg/dL   Nitrite NEGATIVE NEGATIVE   Leukocytes, UA SMALL (A) NEGATIVE  Urine microscopic-add on     Status: Abnormal   Collection Time: 05/11/15 10:33 PM  Result Value Ref Range   Squamous Epithelial / LPF 0-5 (A) NONE SEEN   WBC, UA 6-30 0 - 5 WBC/hpf   RBC / HPF 0-5 0 - 5 RBC/hpf   Bacteria, UA MANY (A) NONE SEEN    Crystals CA OXALATE CRYSTALS (A) NEGATIVE   Ct Head Wo Contrast  05/11/2015  CLINICAL DATA:  Altered mental status. EXAM: CT HEAD WITHOUT CONTRAST TECHNIQUE: Contiguous axial images were obtained from the base of the skull through the vertex without intravenous contrast. COMPARISON:  None. FINDINGS: Ventricles are normal in size, for this patient's age, and  normal in configuration. There are no parenchymal masses or mass effect. There is no evidence of a recent cortical infarct. Mild patchy white matter hypoattenuation is noted consistent with chronic microvascular ischemic change. There are no extra-axial masses or abnormal fluid collections. There is no intracranial hemorrhage. Sinuses and mastoid air cells are clear. IMPRESSION: 1. No acute intracranial abnormalities. 2. Age related volume loss. Mild chronic microvascular ischemic change. Electronically Signed   By: Lajean Manes M.D.   On: 05/11/2015 20:52   Dg Chest Port 1 View  05/11/2015  CLINICAL DATA:  Altered mental status EXAM: PORTABLE CHEST 1 VIEW COMPARISON:  12/02/2014 FINDINGS: Cardiomediastinal silhouette is stable. No acute infiltrate or pleural effusion. No pulmonary edema. Bony thorax is unremarkable. IMPRESSION: No active disease. Electronically Signed   By: Lahoma Crocker M.D.   On: 05/11/2015 20:07    Assessment/Plan 82 year old lady status post severe left MCA territory stroke about 8 months ago presenting with intermittent no status changes with unresponsiveness. Etiology is unclear. However, there is high likelihood that she is experiencing recurrent partial seizures. She has no clear indications of recurrent acute stroke. However, new stroke cannot be ruled out at this point. He should also appears to have probable urinary tract infection, which may be contributing to status changes and generalized weakness  Recommendations: 1. MRI of the brain without contrast 2. Trial of Keppra, loading dose of 1000 mg followed by 500 mg every  12 hours 3. EEG, routine adult study 4. Management of urinary tract infection per primary admitting team  We will continue to follow this patient with you.  C.R. Nicole Kindred, MD [facility] [phone]  05/12/2015, 1:15 AM

## 2015-05-12 NOTE — Progress Notes (Signed)
Patient seen and examined Carrie JohnsDorita Riley is an 82 y.o. female with a history of hypertension, hyperlipidemia, left MCA stroke about 8 months ago as aphasia and right hemiplegia, brought to the emergency room valuation of altered mental status with recurrent episodes of unresponsiveness. No tonic nor clonic activity has been observed. CT scan of the head showed no acute intracranial abnormality. Patient was afebrile. Laboratory studies showed findings indicative of possible urinary tract infection. Her husband indicates that she appears to be early weaker than at baseline.  MRI of the brain negative for acute CVA, Neurology concerned about partial seizures in the setting of prior CVA, possible UTI, started on Keppra EEG , completed results pending Follow results of urine culture Started on rectal aspirin and sent PT OT , speech  consultations requested

## 2015-05-12 NOTE — ED Notes (Signed)
Hospitalist at the bedside 

## 2015-05-12 NOTE — ED Notes (Signed)
EEG at bedside.

## 2015-05-12 NOTE — Progress Notes (Signed)
EEG Completed; Results Pending  

## 2015-05-13 LAB — COMPREHENSIVE METABOLIC PANEL
ALBUMIN: 2.7 g/dL — AB (ref 3.5–5.0)
ALT: 15 U/L (ref 14–54)
ANION GAP: 8 (ref 5–15)
AST: 20 U/L (ref 15–41)
Alkaline Phosphatase: 47 U/L (ref 38–126)
BUN: 11 mg/dL (ref 6–20)
CHLORIDE: 110 mmol/L (ref 101–111)
CO2: 25 mmol/L (ref 22–32)
Calcium: 9 mg/dL (ref 8.9–10.3)
Creatinine, Ser: 0.67 mg/dL (ref 0.44–1.00)
GFR calc Af Amer: >60 mL/min (ref >60)
GFR calc non Af Amer: >60 mL/min (ref >60)
GLUCOSE: 76 mg/dL (ref 65–99)
POTASSIUM: 3.6 mmol/L (ref 3.5–5.1)
SODIUM: 143 mmol/L (ref 135–145)
Total Bilirubin: 0.6 mg/dL (ref 0.3–1.2)
Total Protein: 5.9 g/dL — ABNORMAL LOW (ref 6.5–8.1)

## 2015-05-13 LAB — CBC
HCT: 36.7 % (ref 36.0–46.0)
HEMOGLOBIN: 12 g/dL (ref 12.0–15.0)
MCH: 30.7 pg (ref 26.0–34.0)
MCHC: 32.7 g/dL (ref 30.0–36.0)
MCV: 93.9 fL (ref 78.0–100.0)
PLATELETS: 231 10*3/uL (ref 150–400)
RBC: 3.91 MIL/uL (ref 3.87–5.11)
RDW: 15.1 % (ref 11.5–15.5)
WBC: 6.7 10*3/uL (ref 4.0–10.5)

## 2015-05-13 MED ORDER — LEVETIRACETAM 500 MG PO TABS: 500.0000 mg | ORAL_TABLET | Freq: Two times a day (BID) | ORAL | Status: DC

## 2015-05-13 NOTE — Progress Notes (Signed)
Blair Marini to be D/C'd to home with home health per MD order.  Discussed with the patient and all questions fully answered.  VSS, Skin clean, dry and intact without evidence of skin break down, no evidence of skin tears noted. IV catheter discontinued intact. Site without signs and symptoms of complications. Dressing and pressure applied.  An After Visit Summary was printed and given to the patient's husband. Patient's husband received prescription.  D/c education completed with patient/family including follow up instructions, medication list, d/c activities limitations if indicated, with other d/c instructions as indicated by MD - patient unable to verbalize understanding but husband was, all questions fully answered.   Patient's husband instructed to return patient to ED, call 911, or call MD for any changes in condition.   Patient escorted via WC, and D/C home via private auto.  Joellyn HaffKayla L Price 05/13/2015 4:08 PM

## 2015-05-13 NOTE — Discharge Summary (Signed)
Physician Discharge Summary  Carrie Riley ZOX:[id number] DOB: [birth year] DOA: 05/11/2015  PCP: No PCP Per Patient  Admit date: 05/11/2015 Discharge date: 05/13/2015  Time spent: 50 minutes  Recommendations for Outpatient Follow-up:  1. Recommend not treating for asymptomatic bacteriuria as an outpatient-received 2 doses of ceftriaxone in the hospital and this was discontinued given lack of symptoms and clinical correlation 2. New Medication-Keppra 500 twice a day 3. Please follow-up with your neurologist Dr.Penumalli as an outpatient in about 1-2 weeks and call his office to make an appointment 4. We will ask therapy services to follow patient as an outpatient 5. Patient will need to be turned every 2-3 hours to prevent further breakdown on her bottom  Discharge Diagnoses:  Principal Problem:   Altered mental status Active Problems:   UTI (urinary tract infection)   Altered mental state   Discharge Condition: Fair  Diet recommendation: Dysphagia 1  Filed Weights   05/11/15 1859 05/12/15 1355  Weight: 58.968 kg (130 lb) 47.628 kg (105 lb)    History of present illness:  year-old  patient was kept in the hospital and reviewed by neurology.female,  History of fall 08/13/2014--assessed at that time by neurologist Dr. Marjory Lies at [facility] on 09/13/14 She denied workup with MRI, TTE, cardiac monitoring at that time from what is documented by Dr. Marjory Lies known history of right-sided CVA Prior crush injury second finger on left side Onychogryphosis Depression, hypertension,   presented to [facility] emergency room with toxic metabolic encephalopathy No intermittent status changes High likelihood? Recurrent partial seizures per Dr. Roseanne Reno of neurology ? Urinary tract infection  on admission noted WBC 5, platelet 232 Chloride 109 Urine/creatinine 19/0.7 ALT 12 Total protein 5.9 Albumin 2.9 42 troponin 0.03 EKG sinus rhythm  Hospital Course:   Patient was observed in the hospital and had a workup including CT scan head, MRI, EEG It was felt that patient's symptomatology was not in keeping with urinary tract infection Had a long discussion with her husband who said that she was close to baseline on 05/13/15. She is nonverbal and is unable to communicate properly without the aid of a chart which has various numbers as well as yes and not the bottom of it He states that she is more dependent on him nowadays and that he can manage her at home and that he feels that she is at her prior to admission baseline It was noted that the patient had a prior to admission stage II right buttock bedsore which necessitated her being turned.-She will benefit from home health RN/PT/OT/aid and she will subsequently be able to follow-up as an outpatient with outpatient therapy The only new medication from this admission is Keppra 500 twice a day which was okayed by neurologist rounding Dr. Otelia Limes on day of discharge. Patient should follow up with Dr. Marjory Lies of [facility] for further neurological workup  Consultants:  Neurology  Procedures: EEG 3/25  Abnormal EEG in the drowsy state demonstrating: 1. Mild-moderate slowing consistent with mild-moderate encephalopathy.  2. No epileptiform discharges or seizures are seen.  Antibiotics:  Urine culture [clean catch] from 3/24   Ceftriaxone 3/24-3/26  Discharge Exam: Filed Vitals:   05/12/15 2154 05/13/15 0551  BP: 143/82 165/89  Pulse: 92 93  Temp: 98 F (36.7 C) 97.9 F (36.6 C)  Resp: 20 18    Alert nonverbal and incomprehensible. S1-S2 no murmur rub or gallop Dense hemiparesis on the right side Patient is able to communicate to some extent with her husband  but it is difficult to determine exactly what she is having all the time Bedsore noted on right bottom area   Discharge Instructions   Discharge Instructions    Diet - low sodium heart healthy    Complete by:  As directed       Discharge instructions    Complete by:  As directed   Start new medication of Keppra 500 bid for concerns of confusion being secondary to seizure You do not need any further antibiotics for treatment of asymptomatic bacteriuria and received 2 doses of antibiotics in the hospital I would recommend close follow-up with outpatient neurologist as well as close follow-up with PT/OT/speech therapy to be ordered on discharge to come to your home I would recommend also lab work in about one week to determine how your labs are and continue your prior to admission medication as well.     Increase activity slowly    Complete by:  As directed           Current Discharge Medication List    START taking these medications   Details  levETIRAcetam (KEPPRA) 500 MG tablet Take 1 tablet (500 mg total) by mouth 2 (two) times daily. Qty: 60 tablet, Refills: 0      CONTINUE these medications which have NOT CHANGED   Details  amLODipine (NORVASC) 5 MG tablet Take 5 mg by mouth daily.    azithromycin (ZITHROMAX) 250 MG tablet Take 2 tabs PO x 1 dose, then 1 tab PO QD x 4 days Qty: 6 tablet, Refills: 0   Associated Diagnoses: Acute bronchitis, unspecified organism    celecoxib (CELEBREX) 100 MG capsule Take 1 capsule (100 mg total) by mouth 2 (two) times daily. Qty: 30 capsule, Refills: 2   Associated Diagnoses: Primary osteoarthritis of left knee    Celery Seed OIL Take 120 mLs by mouth 4 (four) times daily.    Ginkgo Biloba (GINKOBA PO) Take 1 tablet by mouth daily.     HYDROcodone-acetaminophen (NORCO/VICODIN) 5-325 MG tablet Take 2 tablets by mouth every 4 (four) hours as needed. Qty: 6 tablet, Refills: 0    HYDROcodone-homatropine (HYCODAN) 5-1.5 MG/5ML syrup Take 5 mLs by mouth every 8 (eight) hours as needed for cough. Qty: 120 mL, Refills: 0   Associated Diagnoses: Acute bronchitis, unspecified organism    losartan (COZAAR) 100 MG tablet Take 100 mg by mouth daily.    magnesium 30 MG  tablet Take 30 mg by mouth 2 (two) times daily.     Misc Natural Products (TURMERIC CURCUMIN) CAPS Take 1 capsule by mouth 2 (two) times daily.    sertraline (ZOLOFT) 25 MG tablet Take 1 tablet (25 mg total) by mouth daily. Qty: 30 tablet, Refills: 1       Allergies  Allergen Reactions  . Ciprofloxacin Rash    On bilateral arms  . Citric Acid Hives      The results of significant diagnostics from this hospitalization (including imaging, microbiology, ancillary and laboratory) are listed below for reference.    Significant Diagnostic Studies: Ct Head Wo Contrast  05/11/2015  CLINICAL DATA:  Altered mental status. EXAM: CT HEAD WITHOUT CONTRAST TECHNIQUE: Contiguous axial images were obtained from the base of the skull through the vertex without intravenous contrast. COMPARISON:  None. FINDINGS: Ventricles are normal in size, for this patient's age, and normal in configuration. There are no parenchymal masses or mass effect. There is no evidence of a recent cortical infarct. Mild patchy white matter hypoattenuation is noted  consistent with chronic microvascular ischemic change. There are no extra-axial masses or abnormal fluid collections. There is no intracranial hemorrhage. Sinuses and mastoid air cells are clear. IMPRESSION: 1. No acute intracranial abnormalities. 2. Age related volume loss. Mild chronic microvascular ischemic change. Electronically Signed   By: Amie Portland M.D.   On: 05/11/2015 20:52   Mr Brain Wo Contrast  05/12/2015  CLINICAL DATA:  Altered mental status, intermittent unresponsiveness. History of LEFT middle cerebral artery territory stroke, hypertension and hyperlipidemia. EXAM: MRI HEAD WITHOUT CONTRAST TECHNIQUE: Multiplanar, multiecho pulse sequences of the brain and surrounding structures were obtained without intravenous contrast. COMPARISON:  CT head May 11, 2015 FINDINGS: The ventricles and sulci are normal for patient's age. No abnormal parenchymal signal,  mass lesions, mass effect. Patchy to confluent supratentorial and pontine white matter FLAIR T2 hyperintensities. No reduced diffusion to suggest acute ischemia. Old small LEFT frontal convexity cortical to subcortical infarct. No susceptibility artifact to suggest hemorrhage. No abnormal extra-axial fluid collections. No extra-axial masses though, contrast enhanced sequences would be more sensitive. Major intracranial vascular flow voids seen at the skull base. Dolicoectatic intracranial vessels can be seen with chronic hypertension. Status post RIGHT ocular lens implant. No abnormal sellar expansion. No suspicious calvarial bone marrow signal. Craniocervical junction maintained. Visualized paranasal sinuses and mastoid air cells are well-aerated. IMPRESSION: No acute intracranial process, specifically no acute ischemia. Moderate chronic small vessel ischemic disease. Old small LEFT frontal lobe infarct. Electronically Signed   By: Awilda Metro M.D.   On: 05/12/2015 02:05   Dg Chest Port 1 View  05/11/2015  CLINICAL DATA:  Altered mental status EXAM: PORTABLE CHEST 1 VIEW COMPARISON:  12/02/2014 FINDINGS: Cardiomediastinal silhouette is stable. No acute infiltrate or pleural effusion. No pulmonary edema. Bony thorax is unremarkable. IMPRESSION: No active disease. Electronically Signed   By: Natasha Mead M.D.   On: 05/11/2015 20:07    Microbiology: No results found for this or any previous visit (from the past 240 hour(s)).   Labs: Basic Metabolic Panel:  Recent Labs Lab 05/11/15 2143 05/12/15 0223 05/13/15 0603  NA 143 145 143  K 3.5 3.7 3.6  CL 109 112* 110  CO2 GLUCOSE 86 86 76  BUN CREATININE 0.76 0.69 0.67  CALCIUM 9.4 9.2 9.0   Liver Function Tests:  Recent Labs Lab 05/11/15 2143 05/13/15 0603  AST 18 20  ALT 12* 15  ALKPHOS 46 47  BILITOT 0.6 0.6  PROT 5.9* 5.9*  ALBUMIN 2.9* 2.7*   No results for input(s): LIPASE, AMYLASE in the last 168  hours. No results for input(s): AMMONIA in the last 168 hours. CBC:  Recent Labs Lab 05/11/15 2143 05/12/15 0223 05/13/15 0603  WBC 5.7 5.4 6.7  NEUTROABS 2.6  --   --   HGB 11.3* 11.2* 12.0  HCT 35.6* 34.1* 36.7  MCV 93.9 94.5 93.9  PLT 232 218 231   Cardiac Enzymes:  Recent Labs Lab 05/11/15 2143  TROPONINI <0.03   BNP: BNP (last 3 results) No results for input(s): BNP in the last 8760 hours.  ProBNP (last 3 results) No results for input(s): PROBNP in the last 8760 hours.  CBG: No results for input(s): GLUCAP in the last 168 hours.     SignedRhetta Mura MD   [facility] 05/13/2015, 8:24 AM

## 2015-05-13 NOTE — Evaluation (Signed)
Physical Therapy Evaluation Patient Details Name: Carrie SaleDorita Riley MRN: [id number] DOB: Apr 05, 1932 Today's Date: 05/13/2015   History of Present Illness  Pt is a 82 y/o F w h/o stroke w/ resultant weakness and aphasia.  She presented after 2 hour unresponsive episode at home w/ husband.  Dx: toxic metabolic encephalopathy, partial seizures?  Additional PMH includes depression.    Clinical Impression  Pt admitted with above diagnosis. Pt currently with functional limitations due to the deficits listed below (see PT Problem List). Mrs. Casher lives w/ husband who is her primary caregiver and PTA requiring total assist for bed mobility, transfers, and WC mobility.  She presents w/ impaired communication, strength, balance and currently requires +2 assist for bed mobility.  Strongly recommend SW consult today to discuss long term planning w/ husband and pt.  Pt will benefit from skilled PT to increase their independence and safety with mobility to allow discharge to the venue listed below.      Follow Up Recommendations SNF;Supervision/Assistance - 24 hour    Equipment Recommendations  Other (comment) Michiel Sites(Hoyer lift)    Recommendations for Other Services OT consult     Precautions / Restrictions Precautions Precautions: Fall Precaution Comments: sacral bed sore  Restrictions Weight Bearing Restrictions: No      Mobility  Bed Mobility               General bed mobility comments: Did not assess.  Per nurse tech, requires +2 assist for rolling.  Transfers                 General transfer comment: Not assessed for pt/therapist safety  Ambulation/Gait                Stairs            Wheelchair Mobility    Modified Rankin (Stroke Patients Only)       Balance Overall balance assessment: Needs assistance                                           Pertinent Vitals/Pain Pain Assessment: Faces Faces Pain Scale: Hurts little more Pain Location:  muscle guarding w/ Lt elbow flexion likely due to IV site Pain Descriptors / Indicators: Guarding Pain Intervention(s): Limited activity within patient's tolerance;Monitored during session    Home Living Family/patient expects to be discharged to:: Private residence Living Arrangements: Spouse/significant other Available Help at Discharge: Family;Available 24 hours/day (husband; youngest daughter lives next door) Type of Home: House Home Access: Stairs to enter Entrance Stairs-Rails: None Entrance Stairs-Number of Steps: 1 (very shallow, WC easily rolled into house) Home Layout: One level Home Equipment: Bedside commode;Wheelchair - [employer] Corporationmanual;Walker - 2 wheels (Has a regular bed that has ability for elevated HOB)      Prior Function Level of Independence: Needs assistance   Gait / Transfers Assistance Needed: Non ambulatory since "sometime last year" per husband. [facility] by standing on spinning platform while husband lifts and spins [facility].    ADL's / Homemaking Assistance Needed: Husband assists pt w/ sponge bathing in bed or sitting up as well as dressing.  Needs assist feeding.  Comments: [facility]  No assist in the home, husband is primary caregiver.     Hand Dominance   Dominant Hand: Right    Extremity/Trunk Assessment   Upper Extremity  Assessment: Defer to OT evaluation;Difficult to assess due to impaired cognition           Lower Extremity Assessment: RLE deficits/detail;LLE deficits/detail;Difficult to assess due to impaired cognition RLE Deficits / Details: No AROM appreciated, pt unable to follow command. Bil PF contractures.  Bil knee PROM limited to ~90 deg. LLE Deficits / Details: No AROM appreciated, pt unable to follow command. Bil PF contractures.  Bil knee PROM limited to ~90 deg.  Cervical / Trunk Assessment: Kyphotic  Communication   Communication: Receptive difficulties;Expressive  difficulties  Cognition Arousal/Alertness: Awake/alert;Lethargic Behavior During Therapy: Flat affect Overall Cognitive Status: History of cognitive impairments - at baseline                      General Comments General comments (skin integrity, edema, etc.): Educated husband pt on repositioning every 2 hrs w/ use of pillows as well as pillow placement between knees.  Pt reports he had never been educated on pressure relief techniques.  Additionally educated pt on PROM techniques for UE/LE.  Discussed value of SW consult to assist pt and husband w/ long term planning as pt will not be able to meet pt's needs 02-22-08 years in the future.  Pt reluctant and does not seem to grasp reasoning for planning, but is agreeable to SW consult, order placed by RN.    Exercises General Exercises - Upper Extremity Shoulder Flexion: PROM;Both;10 reps;Supine Elbow Flexion: PROM;Both;10 reps;Supine Elbow Extension: PROM;Both;10 reps;Supine General Exercises - Lower Extremity Ankle Circles/Pumps: PROM;Both;10 reps;Supine Heel Slides: PROM;Both;10 reps;Supine Hip ABduction/ADduction: PROM;10 reps;Supine;Right Straight Leg Raises: PROM;Both;10 reps;Supine      Assessment/Plan    PT Assessment Patient needs continued PT services  PT Diagnosis Difficulty walking;Generalized weakness;Altered mental status   PT Problem List Decreased strength;Decreased range of motion;Decreased activity tolerance;Decreased balance;Decreased mobility;Decreased coordination;Decreased cognition;Decreased knowledge of use of DME;Decreased safety awareness;Decreased knowledge of precautions;Impaired sensation;Decreased skin integrity;Pain  PT Treatment Interventions DME instruction;Functional mobility training;Therapeutic activities;Therapeutic exercise;Balance training;Neuromuscular re-education;Cognitive remediation;Patient/family education   PT Goals (Current goals can be found in the Care Plan section) Acute Rehab PT  Goals Patient Stated Goal: unable to state PT Goal Formulation: With family Time For Goal Achievement: 05/27/15 Potential to Achieve Goals: Fair    Frequency Min 2X/week   Barriers to discharge Decreased caregiver support only assist available from husband    Co-evaluation               End of Session   Activity Tolerance: Patient limited by fatigue Patient left: in bed;with call bell/phone within reach;with bed alarm set;with family/visitor present Nurse Communication: Mobility status;Other (comment);Need for lift equipment (SW consult prior to d/c)         Time: 4098-1191 PT Time Calculation (min) (ACUTE ONLY): 28 min   Charges:   PT Evaluation $PT Eval High Complexity: 1 Procedure PT Treatments $Therapeutic Exercise: 8-22 mins   PT G Codes:       Encarnacion Chu PT, DPT  Pager: [phone] Phone: [phone] 05/13/2015, 12:07 PM

## 2015-05-13 NOTE — Care Management Note (Signed)
Case Management Note  Patient Details  Name: Onalee Steinbach MRN: [id number] Date of Birth: 17-Oct-1932  Subjective/Objective:                  AMS Action/Plan: Discharge planning Expected Discharge Date:  05/13/15               Expected Discharge Plan:  [place]  In-House Referral:     Discharge planning Services  CM Consult  Post Acute Care Choice:    Choice offered to:  Patient, Spouse  DME Arranged:    DME Agency:  NA  HH Arranged:  RN, PT, OT, Speech Therapy HH Agency:  [facility]  Status of Service:  Completed, signed off  Medicare Important Message Given:    Date Medicare IM Given:    Medicare IM give by:    Date Additional Medicare IM Given:    Additional Medicare Important Message give by:     If discussed at [employer] Meetings, dates discussed:    Additional Comments: CM met wit pt and spouse in the room to confirm Home plans.  Pt does not speak but communicates with nodding or shaking her head.  Spouse states their daughter lives next door and can help pt in and out of wheelchair.  Family does not need ambulance transport home.  Family choose [facility] to render HHPT/OT/RN/SLP.  Referral called to [facility] rep, Tiffany.  No other CM needs were communicated. Dellie Catholic, RN 05/13/2015, 1:19 PM

## 2015-05-14 LAB — URINE CULTURE: Culture: >=100000

## 2015-06-12 ENCOUNTER — Inpatient Hospital Stay (HOSPITAL_COMMUNITY)
Admission: EM | Admit: 2015-06-12 | Discharge: 2015-06-18 | DRG: 056 | Disposition: A | Discharge status: Home Health | Payer: Medicare Other | Attending: Internal Medicine | Admitting: Internal Medicine

## 2015-06-12 ENCOUNTER — Encounter (HOSPITAL_COMMUNITY): Payer: Self-pay | Admitting: Family Medicine

## 2015-06-12 ENCOUNTER — Emergency Department (HOSPITAL_COMMUNITY): Payer: Medicare Other

## 2015-06-12 DIAGNOSIS — I10 Essential (primary) hypertension: Secondary | ICD-10-CM | POA: Diagnosis present

## 2015-06-12 DIAGNOSIS — Z79899 Other long term (current) drug therapy: Secondary | ICD-10-CM

## 2015-06-12 DIAGNOSIS — I959 Hypotension, unspecified: Secondary | ICD-10-CM | POA: Diagnosis present

## 2015-06-12 DIAGNOSIS — F329 Major depressive disorder, single episode, unspecified: Secondary | ICD-10-CM | POA: Diagnosis present

## 2015-06-12 DIAGNOSIS — Z7982 Long term (current) use of aspirin: Secondary | ICD-10-CM

## 2015-06-12 DIAGNOSIS — F32A Depression, unspecified: Secondary | ICD-10-CM | POA: Diagnosis present

## 2015-06-12 DIAGNOSIS — R000 Tachycardia, unspecified: Secondary | ICD-10-CM | POA: Diagnosis not present

## 2015-06-12 DIAGNOSIS — R4182 Altered mental status, unspecified: Secondary | ICD-10-CM | POA: Diagnosis present

## 2015-06-12 DIAGNOSIS — R64 Cachexia: Secondary | ICD-10-CM | POA: Diagnosis present

## 2015-06-12 DIAGNOSIS — G9340 Encephalopathy, unspecified: Secondary | ICD-10-CM | POA: Diagnosis not present

## 2015-06-12 DIAGNOSIS — Z91018 Allergy to other foods: Secondary | ICD-10-CM

## 2015-06-12 DIAGNOSIS — R4701 Aphasia: Secondary | ICD-10-CM

## 2015-06-12 DIAGNOSIS — R569 Unspecified convulsions: Secondary | ICD-10-CM | POA: Diagnosis present

## 2015-06-12 DIAGNOSIS — Z7401 Bed confinement status: Secondary | ICD-10-CM

## 2015-06-12 DIAGNOSIS — E876 Hypokalemia: Secondary | ICD-10-CM | POA: Diagnosis not present

## 2015-06-12 DIAGNOSIS — Z681 Body mass index (BMI) 19 or less, adult: Secondary | ICD-10-CM

## 2015-06-12 DIAGNOSIS — I82611 Acute embolism and thrombosis of superficial veins of right upper extremity: Secondary | ICD-10-CM | POA: Diagnosis not present

## 2015-06-12 DIAGNOSIS — R451 Restlessness and agitation: Secondary | ICD-10-CM | POA: Diagnosis not present

## 2015-06-12 DIAGNOSIS — I6300 Cerebral infarction due to thrombosis of unspecified precerebral artery: Secondary | ICD-10-CM | POA: Insufficient documentation

## 2015-06-12 DIAGNOSIS — R1310 Dysphagia, unspecified: Secondary | ICD-10-CM | POA: Diagnosis present

## 2015-06-12 DIAGNOSIS — I639 Cerebral infarction, unspecified: Secondary | ICD-10-CM | POA: Diagnosis present

## 2015-06-12 DIAGNOSIS — I82409 Acute embolism and thrombosis of unspecified deep veins of unspecified lower extremity: Secondary | ICD-10-CM

## 2015-06-12 DIAGNOSIS — I69398 Other sequelae of cerebral infarction: Principal | ICD-10-CM

## 2015-06-12 DIAGNOSIS — E785 Hyperlipidemia, unspecified: Secondary | ICD-10-CM | POA: Diagnosis present

## 2015-06-12 DIAGNOSIS — L89152 Pressure ulcer of sacral region, stage 2: Secondary | ICD-10-CM | POA: Diagnosis present

## 2015-06-12 DIAGNOSIS — Z515 Encounter for palliative care: Secondary | ICD-10-CM | POA: Insufficient documentation

## 2015-06-12 DIAGNOSIS — I69320 Aphasia following cerebral infarction: Secondary | ICD-10-CM

## 2015-06-12 DIAGNOSIS — I214 Non-ST elevation (NSTEMI) myocardial infarction: Secondary | ICD-10-CM

## 2015-06-12 DIAGNOSIS — I69351 Hemiplegia and hemiparesis following cerebral infarction affecting right dominant side: Secondary | ICD-10-CM

## 2015-06-12 DIAGNOSIS — Z881 Allergy status to other antibiotic agents status: Secondary | ICD-10-CM

## 2015-06-12 HISTORY — DX: Essential (primary) hypertension: I10

## 2015-06-12 LAB — I-STAT TROPONIN, ED: TROPONIN I, POC: 0.4 ng/mL — AB (ref 0.00–0.08)

## 2015-06-12 LAB — URINALYSIS, ROUTINE W REFLEX MICROSCOPIC
Glucose, UA: NEGATIVE mg/dL
Hgb urine dipstick: NEGATIVE
KETONES UR: 15 mg/dL — AB
Leukocytes, UA: NEGATIVE
NITRITE: NEGATIVE
PROTEIN: 30 mg/dL — AB
Specific Gravity, Urine: 1.034 — ABNORMAL HIGH (ref 1.005–1.030)
pH: 5.5 (ref 5.0–8.0)

## 2015-06-12 LAB — URINE MICROSCOPIC-ADD ON

## 2015-06-12 LAB — CBC WITH DIFFERENTIAL/PLATELET
BASOS PCT: 0 %
Basophils Absolute: 0 10*3/uL (ref 0.0–0.1)
EOS ABS: 0.1 10*3/uL (ref 0.0–0.7)
Eosinophils Relative: 1 %
HCT: 38.9 % (ref 36.0–46.0)
HEMOGLOBIN: 12.6 g/dL (ref 12.0–15.0)
Lymphocytes Relative: 37 %
Lymphs Abs: 2.3 10*3/uL (ref 0.7–4.0)
MCH: 29.8 pg (ref 26.0–34.0)
MCHC: 32.4 g/dL (ref 30.0–36.0)
MCV: 92 fL (ref 78.0–100.0)
MONOS PCT: 8 %
Monocytes Absolute: 0.5 10*3/uL (ref 0.1–1.0)
NEUTROS PCT: 54 %
Neutro Abs: 3.4 10*3/uL (ref 1.7–7.7)
PLATELETS: 222 10*3/uL (ref 150–400)
RBC: 4.23 MIL/uL (ref 3.87–5.11)
RDW: 13.7 % (ref 11.5–15.5)
WBC: 6.3 10*3/uL (ref 4.0–10.5)

## 2015-06-12 LAB — COMPREHENSIVE METABOLIC PANEL
ALT: 17 U/L (ref 14–54)
ANION GAP: 11 (ref 5–15)
AST: 18 U/L (ref 15–41)
Albumin: 3 g/dL — ABNORMAL LOW (ref 3.5–5.0)
Alkaline Phosphatase: 56 U/L (ref 38–126)
BILIRUBIN TOTAL: 0.7 mg/dL (ref 0.3–1.2)
BUN: 21 mg/dL — AB (ref 6–20)
CHLORIDE: 112 mmol/L — AB (ref 101–111)
CO2: 21 mmol/L — ABNORMAL LOW (ref 22–32)
Calcium: 9.3 mg/dL (ref 8.9–10.3)
Creatinine, Ser: 0.88 mg/dL (ref 0.44–1.00)
GFR, EST NON AFRICAN AMERICAN: 60 mL/min — AB (ref >60)
Glucose, Bld: 115 mg/dL — ABNORMAL HIGH (ref 65–99)
POTASSIUM: 3.8 mmol/L (ref 3.5–5.1)
Sodium: 144 mmol/L (ref 135–145)
Total Protein: 6.9 g/dL (ref 6.5–8.1)

## 2015-06-12 LAB — PROTIME-INR
INR: 1.32 (ref 0.00–1.49)
Prothrombin Time: 16.5 seconds — ABNORMAL HIGH (ref 11.6–15.2)

## 2015-06-12 LAB — TSH: TSH: 2.838 u[IU]/mL (ref 0.350–4.500)

## 2015-06-12 LAB — TROPONIN I: TROPONIN I: 0.29 ng/mL — AB (ref <0.031)

## 2015-06-12 LAB — I-STAT CG4 LACTIC ACID, ED: LACTIC ACID, VENOUS: 2.14 mmol/L — AB (ref 0.5–2.0)

## 2015-06-12 MED ORDER — HYDROCODONE-ACETAMINOPHEN 5-325 MG PO TABS
1.0000 | ORAL_TABLET | ORAL | Status: DC | PRN
  Administered 2015-06-16 – 2015-06-18 (×2): 1 via ORAL
  Filled 2015-06-12 (×2): qty 1

## 2015-06-12 MED ORDER — SODIUM CHLORIDE 0.9% FLUSH
3.0000 mL | Freq: Two times a day (BID) | INTRAVENOUS | Status: DC
  Administered 2015-06-16 – 2015-06-18 (×4): 3 mL via INTRAVENOUS

## 2015-06-12 MED ORDER — SODIUM CHLORIDE 0.9 % IV BOLUS (SEPSIS)
1000.0000 mL | Freq: Once | INTRAVENOUS | Status: AC
  Administered 2015-06-12: 1000 mL via INTRAVENOUS

## 2015-06-12 MED ORDER — DEXTROSE 5 % IV SOLN
1.0000 g | INTRAVENOUS | Status: AC
  Administered 2015-06-12 – 2015-06-14 (×3): 1 g via INTRAVENOUS
  Filled 2015-06-12 (×3): qty 10

## 2015-06-12 MED ORDER — ASPIRIN 325 MG PO TABS
325.0000 mg | ORAL_TABLET | Freq: Every day | ORAL | Status: DC
  Administered 2015-06-16 – 2015-06-18 (×3): 325 mg via ORAL
  Filled 2015-06-12 (×3): qty 1

## 2015-06-12 MED ORDER — SODIUM CHLORIDE 0.9 % IV SOLN
INTRAVENOUS | Status: AC
  Administered 2015-06-12 – 2015-06-13 (×2): via INTRAVENOUS

## 2015-06-12 MED ORDER — PRAVASTATIN SODIUM 20 MG PO TABS
10.0000 mg | ORAL_TABLET | Freq: Every day | ORAL | Status: DC
  Administered 2015-06-15 – 2015-06-18 (×4): 10 mg via ORAL
  Filled 2015-06-12 (×4): qty 1

## 2015-06-12 MED ORDER — HEPARIN SODIUM (PORCINE) 5000 UNIT/ML IJ SOLN
5000.0000 [IU] | Freq: Three times a day (TID) | INTRAMUSCULAR | Status: DC
  Administered 2015-06-12 – 2015-06-14 (×6): 5000 [IU] via SUBCUTANEOUS
  Filled 2015-06-12 (×7): qty 1

## 2015-06-12 MED ORDER — BACLOFEN 5 MG HALF TABLET
5.0000 mg | ORAL_TABLET | Freq: Every day | ORAL | Status: DC
  Administered 2015-06-15 – 2015-06-17 (×3): 5 mg via ORAL
  Filled 2015-06-12 (×7): qty 1

## 2015-06-12 MED ORDER — LORAZEPAM 2 MG/ML IJ SOLN
0.5000 mg | Freq: Once | INTRAMUSCULAR | Status: AC | PRN
  Administered 2015-06-13: 1 mg via INTRAVENOUS
  Filled 2015-06-12: qty 1

## 2015-06-12 MED ORDER — ONDANSETRON HCL 4 MG PO TABS: 4.0000 mg | ORAL_TABLET | Freq: Four times a day (QID) | ORAL | Status: DC | PRN

## 2015-06-12 MED ORDER — ONDANSETRON HCL 4 MG/2ML IJ SOLN: 4.0000 mg | Freq: Four times a day (QID) | INTRAMUSCULAR | Status: DC | PRN

## 2015-06-12 MED ORDER — LEVETIRACETAM 750 MG PO TABS
750.0000 mg | ORAL_TABLET | Freq: Two times a day (BID) | ORAL | Status: DC
  Filled 2015-06-12 (×3): qty 1

## 2015-06-12 MED ORDER — DOCUSATE SODIUM 100 MG PO CAPS: 100.0000 mg | ORAL_CAPSULE | Freq: Two times a day (BID) | ORAL | Status: DC

## 2015-06-12 MED ORDER — FENTANYL CITRATE (PF) 100 MCG/2ML IJ SOLN
50.0000 ug | INTRAMUSCULAR | Status: AC | PRN
  Administered 2015-06-12 – 2015-06-13 (×2): 50 ug via INTRAVENOUS
  Filled 2015-06-12 (×2): qty 2

## 2015-06-12 MED ORDER — LEVETIRACETAM 500 MG PO TABS
500.0000 mg | ORAL_TABLET | Freq: Two times a day (BID) | ORAL | Status: DC
Start: 2015-06-12 — End: 2015-06-12

## 2015-06-12 NOTE — Consult Note (Signed)
Neurology Consultation Reason for Consult: Altered mental status Referring Physician: Burney Gauze  CC: Altered mental status  History is obtained from: Husband  HPI: Carrie Riley is a 82 y.o. female with a history of previous stroke causing right-sided weakness and aphasia who presents with transient episode of unresponsiveness. She was being seen by a home health aide who apparently observed that she had sudden episode of staring. He called the husband advised that they called 911. She apparently has had several of these episodes in the past, and was started on Keppra for them one month ago. The husband has not seen any of these episodes since that time, until today.  He notes that after the first stroke, she was able to speak some but more recently has lost the ability to speak completely. She saw Dr. Marjory Lies in July 2016 who recommended workup at that time, but it is noted that the patient refused. At that time, he noted a mild hesitancy in speech, but she was able to speak.  More recently, she has lost the ability to speak and was using a speech board at home. It is not clear to me exactly when these changes occurred.  ROS:  Unable to obtain due to altered mental status.   Past Medical History  Diagnosis Date  . Cataract   . Essential hypertension   . Depression   . Hyperlipidemia   . Stroke [facility])      Family History  Problem Relation Age of Onset  . Diabetes Mother   . Diabetes Father   . Alzheimer's disease Father      Social History:  reports that she has never smoked. She has never used smokeless tobacco. She reports that she does not drink alcohol or use illicit drugs.   Exam: Current vital signs: BP 98/76 mmHg  Pulse 98  Temp(Src) 97.7 F (36.5 C) (Axillary)  Resp 16  SpO2 93% Vital signs in last 24 hours: Temp:  [97.7 F (36.5 C)-99.5 F (37.5 C)] 97.7 F (36.5 C) (04/25 1851) Pulse Rate:  [97-111] 98 (04/25 1851) Resp:  [14-19] 16 (04/25 1851) BP:  (94-146)/(65-90) 98/76 mmHg (04/25 1851) SpO2:  [93 %-100 %] 93 % (04/25 1851)   Physical Exam  Constitutional: Appears well-developed and well-nourished.  Psych: Affect appropriate to situation Eyes: No scleral injection HENT: No OP obstrucion Head: Normocephalic.  Cardiovascular: Normal rate and regular rhythm.  Respiratory: Effort normal and breath sounds normal to anterior ascultation GI: Soft.  No distension. There is no tenderness.  Skin: WDI  Neuro: Mental Status: Patient is awake, alert, She is globally aphasic, does not clearly follow commands both up my fingers in her hand she does grip it. Cranial Nerves: II: Blinks to threat bilaterally Pupils are equal, round, and reactive to light.   III,IV, VI: EOMI without ptosis or diploplia.  V: Facial sensation is symmetric  VII: Facial movement is symmetric.  Motor: She has a right hemi-paresis, moves the left arm well, she minimally withdraws bilateral legs to noxious stimulation. Sensory: She response to noxious stimulation bilaterally Deep Tendon Reflexes: She is brisk throughout Cerebellar: She does not perform   I have reviewed labs in epic and the results pertinent to this consultation are: Lactate-elevated CMP-unremarkable  I have reviewed the images obtained: MRI brain from 3/25-severe atrophy with previous left frontal infarct which is very small  Impression: 82 year old female with progressive right-sided weakness and aphasia presumably from previous stroke. The episodes of the husband describes of unresponsiveness, I do  think are concerning for seizures. I agree with continuing Keppra and will increase this given the episode today.  Recommendations: 1) increase Keppra to 750 twice a day 2) MRI brain, MRA head and neck 3) neurology will continue to follow   Ritta SlotMcNeill Kirkpatrick, MD [facility] [phone]  If 7pm- 7am, please page neurology on call as listed in AMION.

## 2015-06-12 NOTE — ED Notes (Signed)
PT presents from home via [facility] with c/o AMS - pt has hx prior stroke with significant right sided deficits and aphasia; over the last 24 hours she has stopped eating/drinking and is moaning more often.  Home health was at home with the pt and pt's husband and decided with the husband to call EMS.  Pt is Alert and moaning, but will not follow commands.

## 2015-06-12 NOTE — ED Notes (Signed)
Patient transported to CT 

## 2015-06-12 NOTE — Progress Notes (Signed)
Patient taken to MRI. Per Debarah Crapelaudia, they are unable to complete due to her hollering loudly. Paged Physician on call for Attivan, etc so they may reschedule for later on tonight.

## 2015-06-12 NOTE — ED Provider Notes (Signed)
CSN: [id number]     Arrival date & time 06/12/15  1517 History   First MD Initiated Contact with Patient 06/12/15 1530     Chief Complaint  Patient presents with  . Altered Mental Status     (Consider location/radiation/quality/duration/timing/severity/associated sxs/prior Treatment) HPI Comments: Level V caveat secondary to AMS; patient also nonverbal secondary to stroke.  82 year old female with a history of hypertension, depression, hyperlipidemia, and stroke with residual right-sided deficits and significant aphasia leaving the patient nonverbal presents to the emergency department for evaluation of altered mental status. Family reports that the patient has been eating and drinking less over the past 24 hours. She has also been moaning more often at home which was concerning to the family. Patient is cared for by home health who noticed some low blood pressure earlier today with a systolic pressure of 98. Husband states this is what prompted a call to EMS. Patient has had no syncope prior to arrival. No reported fevers. Husband does report that the patient usually uses a word board to communicate as she is unable to speak. He has noticed that she has progressively lost inability to use this over the past 2-3 days.  Patient is a 82 y.o. female presenting with altered mental status. The history is provided by the spouse. No language interpreter was used.  Altered Mental Status Associated symptoms: agitation     Past Medical History  Diagnosis Date  . Cataract   . Hypertension   . Depression   . Hyperlipidemia   . Stroke [facility])    Past Surgical History  Procedure Laterality Date  . Tubal ligation    . Hernia repair      hiatal, years ago  . Cataract extraction Right 10/2013  . Tonsillectomy    . Iud removed      due to infection   Family History  Problem Relation Age of Onset  . Diabetes Mother   . Diabetes Father   . Alzheimer's disease Father    Social History  Substance  Use Topics  . Smoking status: Never Smoker   . Smokeless tobacco: Never Used  . Alcohol Use: No   OB History    No data available      Review of Systems  Unable to perform ROS: Mental status change  Constitutional: Positive for appetite change.  Psychiatric/Behavioral: Positive for agitation.    Allergies  Ciprofloxacin; Other; and Citric acid  Home Medications   Prior to Admission medications   Medication Sig Start Date End Date Taking? Authorizing Provider  acetaminophen (RA ACETAMINOPHEN) 650 MG CR tablet Take 650 mg by mouth every 8 (eight) hours as needed for pain. Reported on 05/13/2015    Historical Provider, MD  AMITIZA 24 MCG capsule Take 24 mcg by mouth 2 (two) times daily. 05/04/15   Historical Provider, MD  azithromycin (ZITHROMAX) 250 MG tablet Take 2 tabs PO x 1 dose, then 1 tab PO QD x 4 days Patient not taking: Reported on 05/13/2015 12/11/14   Elvina Sidle, MD  baclofen (LIORESAL) 10 MG tablet Take 5 mg by mouth at bedtime. For 30 days ending 06-05-15 05/05/15   Historical Provider, MD  celecoxib (CELEBREX) 100 MG capsule Take 1 capsule (100 mg total) by mouth 2 (two) times daily. Patient not taking: Reported on 05/13/2015 12/11/14   Elvina Sidle, MD  [employer] ASPIRIN 325 MG tablet Take 325 mg by mouth daily. 04/05/15   Historical Provider, MD  docusate sodium (COLACE) 100 MG capsule Take 100  mg by mouth 2 (two) times daily. 05/04/15   Historical Provider, MD  gabapentin (NEURONTIN) 100 MG capsule Take 100 mg by mouth 3 (three) times daily. 05/04/15   Historical Provider, MD  HYDROcodone-acetaminophen (NORCO/VICODIN) 5-325 MG tablet Take 2 tablets by mouth every 4 (four) hours as needed. Patient not taking: Reported on 05/13/2015 12/01/14   Samantha Tripp Dowless, PA-C  HYDROcodone-homatropine [facility](HYCODAN) 5-1.5 MG/5ML syrup Take 5 mLs by mouth every 8 (eight) hours as needed for cough. Patient not taking: Reported on 05/13/2015 12/11/14   Elvina SidleKurt Lauenstein, MD  levETIRAcetam  (KEPPRA) 500 MG tablet Take 1 tablet (500 mg total) by mouth 2 (two) times daily. 05/13/15   Rhetta MuraJai-Gurmukh Samtani, MD  lisinopril (PRINIVIL,ZESTRIL) 5 MG tablet Take 5 mg by mouth daily. 04/25/15   Historical Provider, MD  megestrol (MEGACE) 40 MG tablet Take 40 mg by mouth 2 (two) times daily. 05/04/15   Historical Provider, MD  mirtazapine (REMERON) 30 MG tablet Take 30 mg by mouth at bedtime.  05/04/15   Historical Provider, MD  Multiple Vitamin (MULTIVITAMIN) tablet Take 1 tablet by mouth daily.    Historical Provider, MD  pravastatin (PRAVACHOL) 10 MG tablet Take 10 mg by mouth daily at 6 PM. 04/25/15   Historical Provider, MD  sertraline (ZOLOFT) 25 MG tablet Take 1 tablet (25 mg total) by mouth daily. Patient not taking: Reported on 12/11/2014 07/30/13   Tonye Pearsonobert P Doolittle, MD  traZODone (DESYREL) 50 MG tablet Take 50 mg by mouth at bedtime. 05/08/15   Historical Provider, MD   BP 119/90 mmHg  Pulse 103  Temp(Src) 99.5 F (37.5 C) (Rectal)  Resp 16  SpO2 98%   Physical Exam  Constitutional: She appears well-developed. No distress.  Chronically thin and frail appearing. Patient moaning and yelling intermittently.  HENT:  Head: Normocephalic and atraumatic.  Mouth/Throat: Oropharynx is clear and moist.  Patient tolerating secretions without difficulty.  Eyes: Conjunctivae and EOM are normal. Pupils are equal, round, and reactive to light. No scleral icterus.  PERRL  Neck: Normal range of motion.  Cardiovascular: Regular rhythm and intact distal pulses.   Mild tachcardia  Pulmonary/Chest: Effort normal and breath sounds normal. No respiratory distress. She has no wheezes. She has no rales.  Respirations even and unlabored  Abdominal: Soft. She exhibits no distension and no mass.  No masses or distension. Abdomen soft.  Musculoskeletal: Normal range of motion.  Neurological: She is alert.  R sided deficits secondary to prior stroke noted. Eyebrow raise is symmetric. No significant facial  droop noted.   Skin: Skin is warm and dry. No rash noted. She is not diaphoretic. No erythema. No pallor.  Psychiatric: She has a normal mood and affect. Her behavior is normal.  Nursing note and vitals reviewed.   ED Course  Procedures (including critical care time) Labs Review Labs Reviewed  COMPREHENSIVE METABOLIC PANEL - Abnormal; Notable for the following:    Chloride 112 (*)    CO2 21 (*)    Glucose, Bld 115 (*)    BUN 21 (*)    Albumin 3.0 (*)    GFR calc non Af Amer 60 (*)    All other components within normal limits  URINALYSIS, ROUTINE W REFLEX MICROSCOPIC (NOT AT [facility]) - Abnormal; Notable for the following:    Color, Urine AMBER (*)    Specific Gravity, Urine 1.034 (*)    Bilirubin Urine MODERATE (*)    Ketones, ur 15 (*)    Protein, ur 30 (*)  All other components within normal limits  URINE MICROSCOPIC-ADD ON - Abnormal; Notable for the following:    Squamous Epithelial / LPF 6-30 (*)    Bacteria, UA RARE (*)    Casts HYALINE CASTS (*)    Crystals CA OXALATE CRYSTALS (*)    All other components within normal limits  I-STAT TROPOININ, ED - Abnormal; Notable for the following:    Troponin i, poc 0.40 (*)    All other components within normal limits  I-STAT CG4 LACTIC ACID, ED - Abnormal; Notable for the following:    Lactic Acid, Venous 2.14 (*)    All other components within normal limits  CBC WITH DIFFERENTIAL/PLATELET    Imaging Review Dg Chest 2 View  06/12/2015  CLINICAL DATA:  Stroke.  Right-sided deficits and aphasia. EXAM: CHEST  2 VIEW COMPARISON:  05/11/2015 FINDINGS: The heart size and mediastinal contours are within normal limits. The pulmonary arteries appear prominent suggestive of PA hypertension. Both lungs are clear. The visualized skeletal structures are unremarkable. IMPRESSION: 1. No acute findings. 2. Prominent pulmonary arteries suggestive of PA hypertension. Electronically Signed   By: Signa Kell M.D.   On: 06/12/2015 16:34   Ct Head  Wo Contrast  06/12/2015  CLINICAL DATA:  Altered mental status EXAM: CT HEAD WITHOUT CONTRAST TECHNIQUE: Contiguous axial images were obtained from the base of the skull through the vertex without intravenous contrast. COMPARISON:  05/12/2015 FINDINGS: The bony calvarium is intact. Atrophic changes and chronic white matter ischemic changes are seen similar to that noted on prior MRI examination. No findings to suggest acute hemorrhage, acute infarction or space-occupying mass lesion are noted. IMPRESSION: No acute abnormality noted. Electronically Signed   By: Alcide Clever M.D.   On: 06/12/2015 16:56     I have personally reviewed and evaluated these images and lab results as part of my medical decision-making.   EKG Interpretation   Date/Time:  Tuesday June 12 2015 15:25:20 EDT Ventricular Rate:  103 PR Interval:  128 QRS Duration: 85 QT Interval:  347 QTC Calculation: 454 R Axis:   72 Text Interpretation:  Sinus tachycardia since last tracing no significant  change Confirmed by BELFI  MD, MELANIE ([id number]) on 06/12/2015 4:38:39 PM      MDM   Final diagnoses:  Altered mental status, unspecified altered mental status type  NSTEMI (non-ST elevated myocardial infarction) [facility])    81 year old female percent to the emergency department for further evaluation of altered mental status. From note history, it appears the patient has had a steady decline since just before July 2016. She has some residual right-sided deficits and aphasia, likely secondary to a prior left-sided stroke. She was seen by neurology on an outpatient basis and declined all interventions at that time. Patient has been moaning a lot lately, making family concerned for the patient being in pain. Patient does have an elevated troponin of 0.4 today; however, she has no EKG changes. Patient mildly tachycardic, though this may be due to dehydration and malnourishment as patient has been eating and drinking less than normal.  Lactate is elevated to 2.14. No fever or leukocytosis today to suggest infectious etiology. UA negative for UTI.  Case discussed with Dr. Thedore Mins of [facility] who has agreed to admit the patient for further management. I believe the family would benefit from a conversation about goals of care given the patient's progressive decline. Dr. Amada Jupiter to consult on patient's case during this admission.   Filed Vitals:   06/12/15 1526 06/12/15  1530 06/12/15 1555 06/12/15 1600  BP: 119/90 111/85  146/76  Pulse: 103 111  104  Temp: 98 F (36.7 C)  99.5 F (37.5 C)   TempSrc: Axillary  Rectal   Resp: SpO2: 98% 99%  98%     Antony Madura, PA-C 06/12/15 1730  Antony Madura, PA-C 06/12/15 1731  Rolan Bucco, MD 06/12/15 1752

## 2015-06-12 NOTE — H&P (Addendum)
TRH H&P   Patient Demographics:    Carrie Riley, is a 82 y.o. female  MRN: [id number]   DOB - 06-05-1932  Admit Date - 06/12/2015  Outpatient Richardean Salerimary MD for the patient is No PCP Per Patient  Referring MD/NP/PA: Tresa EndoKelly PA  Outpatient Specialists:  Neuro - Jamelle RushingVikram Penumali  [facility]  Chief Complaint  Patient presents with  . Altered Mental Status      HPI:    Carrie Riley  is a 82 y.o. female, With history of large left frontal lobe ischemic CVA in the past, aphasic at baseline, dense right-sided hemiparesis with bedbound status at baseline, coming indicates with the help of a sign board, essential hypertension, dyslipidemia, depression, UTI in the past, questionable seizures who lives at home, is bedbound, is aphasic at baseline, brought in today after a home has nurse found her blood pressure to be low and patient to be less responsive than her baseline.  In the ER patient was found to be hypotensive, initial lab work was unremarkable, UA suspicious for UTI but specimen likely was not of good quality, she is afebrile, with the help of husband limited review of systems was obtained as below but was unremarkable. After receiving some IV fluid in the ER patient is already improving and getting close to her baseline.    Review of systems:    She is a phasic at baseline, minimal review of systems obtained with the help of husband, she answers questions with head not, denies any headache, no chest or belly pain. Currently not short of breath.  With Past History of the following :    Past Medical History  Diagnosis Date  . Cataract   . Essential hypertension   . Depression   .  Hyperlipidemia   . Stroke [facility](HCC)       Past Surgical History  Procedure Laterality Date  . Tubal ligation    . Hernia repair      hiatal, years ago  . Cataract extraction Right 10/2013  . Tonsillectomy    . Iud removed      due to infection      Social History:     Social History  Substance Use Topics  . Smoking status: Never Smoker   . Smokeless tobacco: Never Used  . Alcohol Use:  No     Lives - At home with husband, bedbound      Family History :     Family History  Problem Relation Age of Onset  . Diabetes Mother   . Diabetes Father   . Alzheimer's disease Father        Home Medications:   Prior to Admission medications   Medication Sig Start Date End Date Taking? Authorizing Provider  baclofen (LIORESAL) 10 MG tablet Take 5 mg by mouth at bedtime. For 30 days ending 06-05-15 05/05/15  Yes Historical Provider, MD  [employer] ASPIRIN 325 MG tablet Take 325 mg by mouth daily. 04/05/15  Yes Historical Provider, MD  docusate sodium (COLACE) 100 MG capsule Take 100 mg by mouth 2 (two) times daily. 05/04/15  Yes Historical Provider, MD  gabapentin (NEURONTIN) 100 MG capsule Take 100 mg by mouth 3 (three) times daily. 05/04/15  Yes Historical Provider, MD  levETIRAcetam (KEPPRA) 500 MG tablet Take 1 tablet (500 mg total) by mouth 2 (two) times daily. 05/13/15  Yes Rhetta Mura, MD  lisinopril (PRINIVIL,ZESTRIL) 5 MG tablet Take 5 mg by mouth daily. 04/25/15  Yes Historical Provider, MD  megestrol (MEGACE) 40 MG tablet Take 40 mg by mouth 2 (two) times daily. 05/04/15  Yes Historical Provider, MD  Multiple Vitamin (MULTIVITAMIN) tablet Take 1 tablet by mouth daily.   Yes Historical Provider, MD  pravastatin (PRAVACHOL) 10 MG tablet Take 10 mg by mouth daily at 6 PM. 04/25/15  Yes Historical Provider, MD  traZODone (DESYREL) 50 MG tablet Take 50 mg by mouth at bedtime. 05/08/15  Yes Historical Provider, MD  azithromycin (ZITHROMAX) 250 MG tablet Take 2 tabs PO x 1 dose, then 1 tab  PO QD x 4 days Patient not taking: Reported on 05/13/2015 12/11/14   Elvina Sidle, MD  celecoxib (CELEBREX) 100 MG capsule Take 1 capsule (100 mg total) by mouth 2 (two) times daily. Patient not taking: Reported on 05/13/2015 12/11/14   Elvina Sidle, MD  HYDROcodone-acetaminophen (NORCO/VICODIN) 5-325 MG tablet Take 2 tablets by mouth every 4 (four) hours as needed. Patient not taking: Reported on 05/13/2015 12/01/14   Samantha Tripp Dowless, PA-C  HYDROcodone-homatropine [facility]) 5-1.5 MG/5ML syrup Take 5 mLs by mouth every 8 (eight) hours as needed for cough. Patient not taking: Reported on 05/13/2015 12/11/14   Elvina Sidle, MD  sertraline (ZOLOFT) 25 MG tablet Take 1 tablet (25 mg total) by mouth daily. Patient not taking: Reported on 12/11/2014 07/30/13   [name], MD     Allergies:     Allergies  Allergen Reactions  . Ciprofloxacin Rash    On bilateral arms  . Other Other (See Comments)    Pt is a vegetarian and does not eat meat  . Citric Acid Hives     Physical Exam:   Vitals  Blood pressure 94/65, pulse 98, temperature 99.5 F (37.5 C), temperature source Rectal, resp. rate 19, SpO2 99 %.   1. General Extremely frail elderly cachectic AA Female lying in bed in NAD,     2. Awake & more Alert now,  3. No New F.N deficits, ALL C.Nerves Intact, Dense right-sided hemiparesis strength 0/5, left-sided strength 4/5, Plantars down going on the left.  4. Ears and Eyes appear Normal, Conjunctivae clear, PERRLA. Moist Oral Mucosa.  5. Supple Neck, No JVD, No cervical lymphadenopathy appriciated, No Carotid Bruits.  6. Symmetrical Chest wall movement, Good air movement bilaterally, CTAB.  7. RRR, No Gallops, Rubs or Murmurs, No  Parasternal Heave.  8. Positive Bowel Sounds, Abdomen Soft, No tenderness, No organomegaly appriciated,No rebound -guarding or rigidity.  9.  No Cyanosis, Normal Skin Turgor, No Skin Rash or Bruise.  10. Good muscle tone,  joints  appear normal , no effusions, Normal ROM.  11. No Palpable Lymph Nodes in Neck or Axillae      Data Review:    CBC  Recent Labs Lab 06/12/15 1600  WBC 6.3  HGB 12.6  HCT 38.9  PLT 222  MCV 92.0  MCH 29.8  MCHC 32.4  RDW 13.7  LYMPHSABS 2.3  MONOABS 0.5  EOSABS 0.1  BASOSABS 0.0   ------------------------------------------------------------------------------------------------------------------  Chemistries   Recent Labs Lab 06/12/15 1600  NA 144  K 3.8  CL 112*  CO2 21*  GLUCOSE 115*  BUN 21*  CREATININE 0.88  CALCIUM 9.3  AST 18  ALT 17  ALKPHOS 56  BILITOT 0.7   ------------------------------------------------------------------------------------------------------------------ CrCl cannot be calculated (Unknown ideal weight.). ------------------------------------------------------------------------------------------------------------------ No results for input(s): TSH, T4TOTAL, T3FREE, THYROIDAB in the last 72 hours.  Invalid input(s): FREET3  Coagulation profile No results for input(s): INR, PROTIME in the last 168 hours. ------------------------------------------------------------------------------------------------------------------- No results for input(s): DDIMER in the last 72 hours. -------------------------------------------------------------------------------------------------------------------  Cardiac Enzymes No results for input(s): CKMB, TROPONINI, MYOGLOBIN in the last 168 hours.  Invalid input(s): CK ------------------------------------------------------------------------------------------------------------------ No results found for: BNP   ---------------------------------------------------------------------------------------------------------------  Urinalysis    Component Value Date/Time   COLORURINE AMBER* 06/12/2015 1544   APPEARANCEUR CLEAR 06/12/2015 1544   LABSPEC 1.034* 06/12/2015 1544   PHURINE 5.5 06/12/2015 1544    GLUCOSEU NEGATIVE 06/12/2015 1544   HGBUR NEGATIVE 06/12/2015 1544   BILIRUBINUR MODERATE* 06/12/2015 1544   BILIRUBINUR neg 05/11/2014 2138   KETONESUR 15* 06/12/2015 1544   PROTEINUR 30* 06/12/2015 1544   PROTEINUR neg 05/11/2014 2138   UROBILINOGEN 0.2 05/11/2014 2138   NITRITE NEGATIVE 06/12/2015 1544   NITRITE neg 05/11/2014 2138   LEUKOCYTESUR NEGATIVE 06/12/2015 1544    ----------------------------------------------------------------------------------------------------------------   Imaging Results:    Dg Chest 2 View  06/12/2015  CLINICAL DATA:  Stroke.  Right-sided deficits and aphasia. EXAM: CHEST  2 VIEW COMPARISON:  05/11/2015 FINDINGS: The heart size and mediastinal contours are within normal limits. The pulmonary arteries appear prominent suggestive of PA hypertension. Both lungs are clear. The visualized skeletal structures are unremarkable. IMPRESSION: 1. No acute findings. 2. Prominent pulmonary arteries suggestive of PA hypertension. Electronically Signed   By: Signa Kell M.D.   On: 06/12/2015 16:34   Ct Head Wo Contrast  06/12/2015  CLINICAL DATA:  Altered mental status EXAM: CT HEAD WITHOUT CONTRAST TECHNIQUE: Contiguous axial images were obtained from the base of the skull through the vertex without intravenous contrast. COMPARISON:  05/12/2015 FINDINGS: The bony calvarium is intact. Atrophic changes and chronic white matter ischemic changes are seen similar to that noted on prior MRI examination. No findings to suggest acute hemorrhage, acute infarction or space-occupying mass lesion are noted. IMPRESSION: No acute abnormality noted. Electronically Signed   By: Alcide Clever M.D.   On: 06/12/2015 16:56    My personal review of EKG: Rhythm S.Tach, Rate  103/min,  no Acute ST changes   Assessment & Plan:     1. Encephalopathy in a patient with left frontal lobe CVA in the past who is a phasic and bedbound at baseline with dense right-sided hemiparesis. This most  likely is due to simple dehydration with hypotension, possible UTI.   She will be kept for  23 hour observation, IV fluid bolus and maintenance, hold blood pressure medication, repeat in and out catheter UA specimen and culture, place on Rocephin. Check TSH. Monitor on telemetry. PT and speech eval. Hold sedating medications tonight which will include Neurontin, trazodone, will continue low-dose baclofen as she can go into withdrawal. Neurology has been consulted by ER.   2. Possible UTI. Similar presentation due to UTI recently, Rocephin for now, obtain repeat straight cath culture.  3. Dyslipidemia. On statin continue.  4. CVA with baseline aphasia, dense right-sided hemiparesis with bedbound status. Swallow screen bedside by nurses, patient uses feeding assistance at home and eats normal food, nothing by mouth for now until she does swallow screen, speech eval formally in the morning. PT eval and treat.  5. Mild elevation of lactate. This is not sepsis, this is likely due to dehydration with hypotension, hydrate, repeat lactate in the morning.  6. Questionable history of seizures. Continue Keppra.   7. Mild Non specific Trop rise - EKG non acute, denies Chest pain, Trend Trop, ASA-Statin to continue. Tele.   DVT Prophylaxis Heparin   AM Labs Ordered, also please review Full Orders  Family Communication: Admission, patients condition and plan of care including tests being ordered have been discussed with the patient and husband who indicate understanding and agree with the plan and Code Status.  Code Status Full  Likely DC to  Home 1-2 days  Condition GUARDED     Consults called: Neuro by ER  Admission status: Obs tele  Time spent in minutes :35   Susa Raring K M.D on 06/12/2015 at 5:38 PM  Between 7am to 7pm - Pager - [phone]. After 7pm go to [email or web address] - password [facility]  [facility] - Office  [phone]

## 2015-06-12 NOTE — Progress Notes (Signed)
Patient lying in bed, husband present at bedside. Does not appear to be in any pain at this time, call light within reach.

## 2015-06-13 ENCOUNTER — Observation Stay (HOSPITAL_COMMUNITY): Payer: Medicare Other

## 2015-06-13 DIAGNOSIS — Z7401 Bed confinement status: Secondary | ICD-10-CM | POA: Diagnosis not present

## 2015-06-13 DIAGNOSIS — I69398 Other sequelae of cerebral infarction: Secondary | ICD-10-CM | POA: Diagnosis not present

## 2015-06-13 DIAGNOSIS — I69320 Aphasia following cerebral infarction: Secondary | ICD-10-CM | POA: Diagnosis not present

## 2015-06-13 DIAGNOSIS — Z7982 Long term (current) use of aspirin: Secondary | ICD-10-CM | POA: Diagnosis not present

## 2015-06-13 DIAGNOSIS — R55 Syncope and collapse: Secondary | ICD-10-CM | POA: Diagnosis not present

## 2015-06-13 DIAGNOSIS — R4020 Unspecified coma: Secondary | ICD-10-CM | POA: Diagnosis not present

## 2015-06-13 DIAGNOSIS — E785 Hyperlipidemia, unspecified: Secondary | ICD-10-CM | POA: Diagnosis present

## 2015-06-13 DIAGNOSIS — Z681 Body mass index (BMI) 19 or less, adult: Secondary | ICD-10-CM | POA: Diagnosis not present

## 2015-06-13 DIAGNOSIS — Z515 Encounter for palliative care: Secondary | ICD-10-CM | POA: Diagnosis not present

## 2015-06-13 DIAGNOSIS — F329 Major depressive disorder, single episode, unspecified: Secondary | ICD-10-CM

## 2015-06-13 DIAGNOSIS — G9340 Encephalopathy, unspecified: Secondary | ICD-10-CM | POA: Diagnosis present

## 2015-06-13 DIAGNOSIS — R1310 Dysphagia, unspecified: Secondary | ICD-10-CM | POA: Diagnosis present

## 2015-06-13 DIAGNOSIS — R4701 Aphasia: Secondary | ICD-10-CM | POA: Diagnosis not present

## 2015-06-13 DIAGNOSIS — L89152 Pressure ulcer of sacral region, stage 2: Secondary | ICD-10-CM | POA: Diagnosis present

## 2015-06-13 DIAGNOSIS — R400 Somnolence: Secondary | ICD-10-CM | POA: Diagnosis not present

## 2015-06-13 DIAGNOSIS — R000 Tachycardia, unspecified: Secondary | ICD-10-CM | POA: Diagnosis not present

## 2015-06-13 DIAGNOSIS — I959 Hypotension, unspecified: Secondary | ICD-10-CM | POA: Diagnosis present

## 2015-06-13 DIAGNOSIS — I82611 Acute embolism and thrombosis of superficial veins of right upper extremity: Secondary | ICD-10-CM | POA: Diagnosis not present

## 2015-06-13 DIAGNOSIS — Z91018 Allergy to other foods: Secondary | ICD-10-CM | POA: Diagnosis not present

## 2015-06-13 DIAGNOSIS — Z881 Allergy status to other antibiotic agents status: Secondary | ICD-10-CM | POA: Diagnosis not present

## 2015-06-13 DIAGNOSIS — I82409 Acute embolism and thrombosis of unspecified deep veins of unspecified lower extremity: Secondary | ICD-10-CM | POA: Diagnosis not present

## 2015-06-13 DIAGNOSIS — I10 Essential (primary) hypertension: Secondary | ICD-10-CM | POA: Diagnosis present

## 2015-06-13 DIAGNOSIS — R569 Unspecified convulsions: Secondary | ICD-10-CM | POA: Diagnosis present

## 2015-06-13 DIAGNOSIS — I6300 Cerebral infarction due to thrombosis of unspecified precerebral artery: Secondary | ICD-10-CM | POA: Insufficient documentation

## 2015-06-13 DIAGNOSIS — R4182 Altered mental status, unspecified: Secondary | ICD-10-CM | POA: Diagnosis not present

## 2015-06-13 DIAGNOSIS — R64 Cachexia: Secondary | ICD-10-CM | POA: Diagnosis present

## 2015-06-13 DIAGNOSIS — R451 Restlessness and agitation: Secondary | ICD-10-CM | POA: Diagnosis not present

## 2015-06-13 DIAGNOSIS — Z79899 Other long term (current) drug therapy: Secondary | ICD-10-CM | POA: Diagnosis not present

## 2015-06-13 DIAGNOSIS — E876 Hypokalemia: Secondary | ICD-10-CM | POA: Diagnosis not present

## 2015-06-13 DIAGNOSIS — I69351 Hemiplegia and hemiparesis following cerebral infarction affecting right dominant side: Secondary | ICD-10-CM | POA: Diagnosis not present

## 2015-06-13 LAB — BASIC METABOLIC PANEL
Anion gap: 13 (ref 5–15)
BUN: 18 mg/dL (ref 6–20)
CO2: 17 mmol/L — ABNORMAL LOW (ref 22–32)
CREATININE: 0.62 mg/dL (ref 0.44–1.00)
Calcium: 8.6 mg/dL — ABNORMAL LOW (ref 8.9–10.3)
Chloride: 115 mmol/L — ABNORMAL HIGH (ref 101–111)
GFR calc Af Amer: >60 mL/min (ref >60)
GLUCOSE: 78 mg/dL (ref 65–99)
Potassium: 4 mmol/L (ref 3.5–5.1)
SODIUM: 145 mmol/L (ref 135–145)

## 2015-06-13 LAB — CBC
HCT: 36 % (ref 36.0–46.0)
Hemoglobin: 11.9 g/dL — ABNORMAL LOW (ref 12.0–15.0)
MCH: 30.4 pg (ref 26.0–34.0)
MCHC: 33.1 g/dL (ref 30.0–36.0)
MCV: 91.8 fL (ref 78.0–100.0)
PLATELETS: 203 10*3/uL (ref 150–400)
RBC: 3.92 MIL/uL (ref 3.87–5.11)
RDW: 13.9 % (ref 11.5–15.5)
WBC: 4.7 10*3/uL (ref 4.0–10.5)

## 2015-06-13 LAB — TROPONIN I
TROPONIN I: 0.23 ng/mL — AB (ref <0.031)
Troponin I: 0.14 ng/mL — ABNORMAL HIGH (ref <0.031)

## 2015-06-13 LAB — AMMONIA: AMMONIA: 17 umol/L (ref 9–35)

## 2015-06-13 LAB — LACTIC ACID, PLASMA: Lactic Acid, Venous: 2.4 mmol/L (ref 0.5–2.0)

## 2015-06-13 MED ORDER — GADOBENATE DIMEGLUMINE 529 MG/ML IV SOLN
10.0000 mL | Freq: Once | INTRAVENOUS | Status: AC | PRN
  Administered 2015-06-13: 10 mL via INTRAVENOUS

## 2015-06-13 MED ORDER — HALOPERIDOL LACTATE 5 MG/ML IJ SOLN
1.0000 mg | Freq: Four times a day (QID) | INTRAMUSCULAR | Status: DC | PRN
  Administered 2015-06-13 – 2015-06-16 (×4): 1 mg via INTRAVENOUS
  Filled 2015-06-13 (×4): qty 1

## 2015-06-13 MED ORDER — QUETIAPINE FUMARATE 25 MG PO TABS
25.0000 mg | ORAL_TABLET | Freq: Every day | ORAL | Status: DC
  Administered 2015-06-15 – 2015-06-17 (×3): 25 mg via ORAL
  Filled 2015-06-13 (×3): qty 1

## 2015-06-13 MED ORDER — SODIUM CHLORIDE 0.9 % IV SOLN
1500.0000 mg | Freq: Two times a day (BID) | INTRAVENOUS | Status: DC
  Administered 2015-06-13: 1500 mg via INTRAVENOUS
  Filled 2015-06-13 (×2): qty 15

## 2015-06-13 MED ORDER — LEVETIRACETAM 750 MG PO TABS
750.0000 mg | ORAL_TABLET | Freq: Two times a day (BID) | ORAL | Status: DC
  Filled 2015-06-13 (×2): qty 1

## 2015-06-13 MED ORDER — LORAZEPAM 2 MG/ML IJ SOLN
0.5000 mg | Freq: Once | INTRAMUSCULAR | Status: AC | PRN
  Administered 2015-06-13: 0.5 mg via INTRAVENOUS
  Filled 2015-06-13: qty 1

## 2015-06-13 MED ORDER — FENTANYL CITRATE (PF) 100 MCG/2ML IJ SOLN
25.0000 ug | INTRAMUSCULAR | Status: AC | PRN
  Administered 2015-06-14 (×2): 25 ug via INTRAVENOUS
  Filled 2015-06-13 (×2): qty 2

## 2015-06-13 NOTE — Care Management Obs Status (Signed)
MEDICARE OBSERVATION STATUS NOTIFICATION   Patient Details  Name: Carrie Riley MRN: [id number] Date of Birth: [birth year]   Medicare Observation Status Notification Given:  Yes    Darrold SpanWebster, Latish Toutant Hall, RN 06/13/2015, 2:41 PM

## 2015-06-13 NOTE — Evaluation (Signed)
Clinical/Bedside Swallow Evaluation Patient Details  Name: Carrie Riley MRN: [id number] Date of Birth: 05-15-1932  Today's Date: 06/13/2015 Time: SLP Start Time (ACUTE ONLY): 1400 SLP Stop Time (ACUTE ONLY): 1430 SLP Time Calculation (min) (ACUTE ONLY): 30 min  Past Medical History:  Past Medical History  Diagnosis Date  . Cataract   . Essential hypertension   . Depression   . Hyperlipidemia   . Stroke [facility])    Past Surgical History:  Past Surgical History  Procedure Laterality Date  . Tubal ligation    . Hernia repair      hiatal, years ago  . Cataract extraction Right 10/2013  . Tonsillectomy    . Iud removed      due to infection   HPI:  82 y.o. female, With history of large left frontal lobe ischemic CVA in the past, aphasic at baseline, dense right-sided hemiparesis with bedbound status at baseline, coming indicates with the help of a sign board, essential hypertension, dyslipidemia, depression, UTI in the past, questionable seizures who lives at home, is bedbound, brought to ED after [facility] nurse found her blood pressure to be low and patient to be less responsive than her baseline. Improved in ED and approaching baseline. MRI from 3/25 severe atrophy with previous left frontal infarct which is very small. MRI 4/26 no acute findings. Hx of dysphagia with reliance on others for feeding.   Assessment / Plan / Recommendation Clinical Impression  Pt presents with a severe dysphagia.  She is alert, will localize sounds, but did not attempt to communicate; unable to f/c.  Husband present. Mr. Cotta is very dedicated to his wife; has not left her bedside.  He was earnest and asked repeatedly for her to please eat.  Unfortunately, Mrs. Singley demonstrated no anticipation nor recognition of bolus material; there was no initiation of mastication; bite reflex led to removal of tip of plastic spoon. Passive transport of small amounts of water to pharynx led to weak swallow followed by overt s/s of  aspiration. Prognosis for improved eating is poor.  Gentle inquiry into Mr. Ketter plans in the event swallowing does not improve was met with his quiet assurance that she would "get better."  Will follow-up next date for reassessment.  Palliative Medicine has been consulted.      Aspiration Risk  Severe aspiration risk    Diet Recommendation    Sips/chips; NPO pending [place] meeting with Palliative medicine      Other  Recommendations Oral Care Recommendations: Oral care QID   Follow up Recommendations   (tba)    Frequency and Duration min 2x/week  1 week       Prognosis Prognosis for Safe Diet Advancement: Guarded      Swallow Study   General Date of Onset: 06/12/15 HPI: 82 y.o. female, With history of large left frontal lobe ischemic CVA in the past, aphasic at baseline, dense right-sided hemiparesis with bedbound status at baseline, coming indicates with the help of a sign board, essential hypertension, dyslipidemia, depression, UTI in the past, questionable seizures who lives at home, is bedbound, brought to ED after [facility] nurse found her blood pressure to be low and patient to be less responsive than her baseline. Improved in ED and approaching baseline. MRI from 3/25 severe atrophy with previous left frontal infarct which is very small. MRI 4/26 no acute findings. Type of Study: Bedside Swallow Evaluation Previous Swallow Assessment: March 2017 with results indicating likely aspiration Diet Prior to this Study: NPO (sips and  chips) Temperature Spikes Noted: No Respiratory Status: Room air History of Recent Intubation: No Behavior/Cognition: Alert Oral Cavity Assessment: Within Functional Limits Oral Care Completed by SLP: No Oral Cavity - Dentition: Adequate natural dentition Self-Feeding Abilities: Total assist Patient Positioning: Upright in bed Baseline Vocal Quality: Not observed Volitional Cough: Cognitively unable to elicit Volitional Swallow: Unable to elicit     Oral/Motor/Sensory Function Overall Oral Motor/Sensory Function:  (+symmetry )   Ice Chips Ice chips: Not tested   Thin Liquid Thin Liquid: Impaired Presentation: Spoon;Cup Oral Phase Impairments: Reduced labial seal;Reduced lingual movement/coordination;Poor awareness of bolus Oral Phase Functional Implications: Right anterior spillage;Left anterior spillage;Oral holding Pharyngeal  Phase Impairments: Throat Clearing - Immediate;Multiple swallows;Suspected delayed Swallow    Nectar Thick Nectar Thick Liquid: Not tested   Honey Thick Honey Thick Liquid: Not tested   Puree Puree: Impaired Presentation: Spoon Oral Phase Impairments: Reduced lingual movement/coordination;Reduced labial seal;Poor awareness of bolus Oral Phase Functional Implications: Right anterior spillage;Left anterior spillage;Oral holding Pharyngeal Phase Impairments: Suspected delayed Swallow;Wet Vocal Quality;Cough - Delayed   Solid   GO   Solid: Not tested    Functional Assessment Tool Used: clinical judgment Functional Limitations: Swallowing Swallow Current Status (V6122): At least 80 percent but less than 100 percent impaired, limited or restricted Swallow Goal Status [phone]): At least 60 percent but less than 80 percent impaired, limited or restricted   Juan Quam Laurice 06/13/2015,2:43 PM Estill Bamberg L. Tivis Ringer, [place] CCC/SLP Pager [phone]

## 2015-06-13 NOTE — Progress Notes (Signed)
[facility]  Patient Status: Active (receiving services up to time of hospitalization)  [facility] is providing the following services: RN, PT, OT, ST and MSW  If patient discharges after hours, please call [phone].   Carrie BurgerStephanie M Riley 06/13/2015, 9:12 AM

## 2015-06-13 NOTE — Care Management Note (Signed)
Case Management Note Donn PieriniKristi Corynne Scibilia RN, BSN Unit 2W-Case Manager [phone]  Patient Details  Name: Carrie SaleDorita Riley MRN: [id number] Date of Birth: Aug 07, 1932  Subjective/Objective:     Pt admitted with acute encephalopathy (hx CVA)               Action/Plan: PTA pt lived at home with spouse- [facility]/PT/OT/ST/CSW-  PC consult has been ordered for GOC and is pending- per conversation with spouse at bedside- they have needed DME at home- lift was just delivered to home prior to admit and someone is to come out and show them how to use it- husband also states a hospital bed is on order but has yet to be delivered- [facility]- spouse plans on returning home with pt. CM to follow for d/c plans and recommendations after PC mtg.   Expected Discharge Date:                  Expected Discharge Plan:  Home w Home Health Services  In-House Referral:     Discharge planning Services  CM Consult  Post Acute Care Choice:  Home Health Choice offered to:  Spouse  DME Arranged:    DME Agency:  [facility]  HH Arranged:  RN, PT, OT, Speech Therapy, Social Work [facility]  [facility]  Status of Service:  In process, will continue to follow  Medicare Important Message Given:    Date Medicare IM Given:    Medicare IM give by:    Date Additional Medicare IM Given:    Additional Medicare Important Message give by:     If discussed at Long Length of Stay Meetings, dates discussed:    Additional Comments:  Darrold SpanWebster, Jashae Wiggs Hall, RN 06/13/2015, 2:42 PM

## 2015-06-13 NOTE — Progress Notes (Signed)
Subjective: Husband feels that she is back to baseline  Exam: Filed Vitals:   06/13/15 0500 06/13/15 1415  BP: 108/55 123/77  Pulse: 95 98  Temp: 98 F (36.7 C)   Resp: 20 19   Gen: In bed, NAD Resp: non-labored breathing, no acute distress Abd: soft, nt  Neuro: MS: Awake, alert, globally aphasic CN: Blinks to threat bilaterally, tracks across midline both directions Motor: Right hemiparesis Sensory: Responds to noxious stimuli bilaterally  Pertinent Labs: BMP-unremarkable  Impression: 82 year old female with recurrent episodes of unresponsiveness in the setting of previous stroke. The fact that she has had a progressive speech decline following her stroke without clear evidence of new strokes does make me suspect that she likely has some degree of dementia contributing to her decline. I suspect that the episodes of unresponsiveness are partial seizures  Recommendations: 1) continue increased dose of Keppra 2) no further inpatient recommendations at this time, please call with further questions or concerns.  Ritta SlotMcNeill Phillip Sandler, MD [facility] [phone]  If 7pm- 7am, please page neurology on call as listed in [facility].

## 2015-06-13 NOTE — Consult Note (Signed)
Consultation Note Date: 06/13/2015   Patient Name: Carrie Riley  DOB: 12/19/1932  MRN: [id number]  Age / Sex: 82 y.o., female  PCP: No Pcp Per Patient Referring Physician: Richarda OverlieNayana Abrol, MD  Reason for Consultation: Establishing goals of care  HPI/Patient Profile: 82 y.o. female  with past medical history of CVA with right-sided weakness, seizures, dysphagia, UTI, hypertension, hyperlipidemia, and depression admitted on 06/12/2015 with altered mental status and we are treating for UTI as well as seizures.   Clinical Assessment and Goals of Care: I had a conversation with Carrie Riley at bedside. He is completely devoted to her and is her primary caretaker. Upon speaking with him we discuss that she is very sick and we are all concerned about her ability for recovery. He acknowledges concern but says that he is hopeful that she will "get better." He is unable to give specifics on better but says that she seems better this afternoon than she did this morning already. He remains very hopeful. We explore their faith as she is a very spiritual person and discuss how we can rely on faith for strength. He was able to acknowledge that we all have to die and that he hates to think of this but knows that when this happens that they will be together again someday. He is very soft spoken and uncomfortable with thinking about losing his wife. He does give me permission to discuss with his family.   I also spoke with granddaughter, Carrie Riley, who says she is a Engineer, civil (consulting)nurse who worked on a [employer] developer[employer]uro unit. We discuss her grandmother's status and poor prognosis she tells me that they have discussed as a family previously and have decided that they would like feeding tube. We discussed artificial feeding and I fear their expectations for improvement with this (healed skin breakdown, speaking and walking specifically mentioned by granddaughter) is  not very realistic. I discussed further that I fear we are unlikely to meet these expectations because her dysphagia and poor appetite is a side effect of her medical issues with CVA, seizures, and brain changes and not the primary cause of her decline. They still desire artifical feeding. I also discussed code status and she admits she does not believe this has been discussed but feels all family wishes for full code. She seems to understand who resuscitation would not be in her grandmothers best interest.  They all tell me that they believe that Carrie Riley was improving (smiling, interacting, enjoying being outside) at home but keeps having set backs r/t UTI and pressure ulcer. Will clarify with Carrie Riley his desire for feeding tube as well as full code tomorrow.   Contacts/Participants in Discussion: Carrie Riley Primary Decision Maker/Relationship: Carrie Riley    SUMMARY OF RECOMMENDATIONS   - Desire for artificial feeding tube.  - Full code and aggressive care.  - Concerned with these GOC as per neurology note 09/13/2014 [name] did not desire work up, hospitalizations, or even medications for treatment.   Code Status/Advance Care Planning:  Code Status Orders        Start     Ordered   06/12/15 1817  Full code   Continuous     06/12/15 1816    Code Status History    Date Active Date Inactive Code Status Order ID Comments User Context   05/12/2015 12:23 AM 05/13/2015  6:11 PM Full Code [id number]  Carrie Bow, DO ED        Symptom Management:   Pain: Will try Tylenol prn for pain r/t buttocks pressure ulcer that family believes to a source of her decline r/t this pain. Fentanyl 25 mcg every 2 hours prn also available.   Palliative Prophylaxis:   Bowel Regimen, Delirium Protocol, Frequent Pain Assessment, Oral Care and Turn Reposition  Additional Recommendations (Limitations, Scope, Preferences):  Full Scope Treatment  Psycho-social/Spiritual:   Desire for further  Chaplaincy support:yes  Additional Recommendations: Caregiving  Support/Resources  Prognosis:   < 6 weeks - very poor with continued decline.   Discharge Planning: Home with Palliative Services      Primary Diagnoses: Present on Admission:  . Acute encephalopathy . Stroke (HCC) . Hyperlipidemia . Essential hypertension . Depression . Aphasia . Altered mental status . Encephalopathy  I have reviewed the medical record, interviewed the patient and family, and examined the patient. The following aspects are pertinent.  Past Medical History  Diagnosis Date  . Cataract   . Essential hypertension   . Depression   . Hyperlipidemia   . Stroke [facility])    Social History   Social History  . Marital Status: Married    Spouse Name: Carrie Riley  . Number of Children: 2  . Years of Education: 12   Occupational History  . retired     Chief Strategy Officer   Social History Main Topics  . Smoking status: Never Smoker   . Smokeless tobacco: Never Used  . Alcohol Use: No  . Drug Use: No  . Sexual Activity: Not Asked   Other Topics Concern  . None   Social History Narrative   Married to Carrie Riley, lives at home   No caffeine   Family History  Problem Relation Age of Onset  . Diabetes Mother   . Diabetes Father   . Alzheimer's disease Father    Scheduled Meds: . aspirin  325 mg Oral Daily  . baclofen  5 mg Oral QHS  . cefTRIAXone (ROCEPHIN)  IV  1 g Intravenous Q24H  . docusate sodium  100 mg Oral BID  . heparin  5,000 Units Subcutaneous Q8H  . levETIRAcetam  750 mg Oral BID  . pravastatin  10 mg Oral q1800  . sodium chloride flush  3 mL Intravenous Q12H   Continuous Infusions: . sodium chloride 75 mL/hr at 06/13/15 1138   PRN Meds:.HYDROcodone-acetaminophen, ondansetron **OR** ondansetron (ZOFRAN) IV Medications Prior to Admission:  Prior to Admission medications   Medication Sig Start Date End Date Taking? Authorizing Provider  baclofen (LIORESAL) 10 MG tablet Take 5 mg by  mouth at bedtime. For 30 days ending 06-05-15 05/05/15  Yes Historical Provider, MD  [facility] ASPIRIN 325 MG tablet Take 325 mg by mouth daily. 04/05/15  Yes Historical Provider, MD  docusate sodium (COLACE) 100 MG capsule Take 100 mg by mouth 2 (two) times daily. 05/04/15  Yes Historical Provider, MD  gabapentin (NEURONTIN) 100 MG capsule Take 100 mg by mouth 3 (three) times daily. 05/04/15  Yes Historical Provider, MD  levETIRAcetam (KEPPRA) 500 MG tablet Take 1 tablet (500  mg total) by mouth 2 (two) times daily. 05/13/15  Yes Rhetta Mura, MD  lisinopril (PRINIVIL,ZESTRIL) 5 MG tablet Take 5 mg by mouth daily. 04/25/15  Yes Historical Provider, MD  megestrol (MEGACE) 40 MG tablet Take 40 mg by mouth 2 (two) times daily. 05/04/15  Yes Historical Provider, MD  Multiple Vitamin (MULTIVITAMIN) tablet Take 1 tablet by mouth daily.   Yes Historical Provider, MD  pravastatin (PRAVACHOL) 10 MG tablet Take 10 mg by mouth daily at 6 PM. 04/25/15  Yes Historical Provider, MD  traZODone (DESYREL) 50 MG tablet Take 50 mg by mouth at bedtime. 05/08/15  Yes Historical Provider, MD  azithromycin (ZITHROMAX) 250 MG tablet Take 2 tabs PO x 1 dose, then 1 tab PO QD x 4 days Patient not taking: Reported on 05/13/2015 12/11/14   Elvina Sidle, MD  celecoxib (CELEBREX) 100 MG capsule Take 1 capsule (100 mg total) by mouth 2 (two) times daily. Patient not taking: Reported on 05/13/2015 12/11/14   Elvina Sidle, MD  HYDROcodone-acetaminophen (NORCO/VICODIN) 5-325 MG tablet Take 2 tablets by mouth every 4 (four) hours as needed. Patient not taking: Reported on 05/13/2015 12/01/14   Samantha Tripp Dowless, PA-C  HYDROcodone-homatropine [facility]) 5-1.5 MG/5ML syrup Take 5 mLs by mouth every 8 (eight) hours as needed for cough. Patient not taking: Reported on 05/13/2015 12/11/14   Elvina Sidle, MD  sertraline (ZOLOFT) 25 MG tablet Take 1 tablet (25 mg total) by mouth daily. Patient not taking: Reported on 12/11/2014 07/30/13    [name], MD   Allergies  Allergen Reactions  . Ciprofloxacin Rash    On bilateral arms  . Other Other (See Comments)    Pt is a vegetarian and does not eat meat  . Citric Acid Hives   Review of Systems  Unable to perform ROS   Physical Exam  Constitutional: She appears well-developed.  Temporal muscle wasting  HENT:  Head: Normocephalic and atraumatic.  Cardiovascular: Normal rate.   Pulmonary/Chest: No accessory muscle usage. No tachypnea. No respiratory distress. She has decreased breath sounds in the right lower field and the left lower field.  Abdominal: Soft. Normal appearance.  Neurological: She is alert. She is disoriented.  Seems to be trying to communicate but only sounds come out. Does not follow commands.     Vital Signs: BP 123/77 mmHg  Pulse 98  Temp(Src) 98 F (36.7 C) (Axillary)  Resp 19  SpO2 96% Pain Assessment: PAINAD       SpO2: SpO2: 96 % O2 Device:SpO2: 96 % O2 Flow Rate: .   IO: Intake/output summary:  Intake/Output Summary (Last 24 hours) at 06/13/15 1624 Last data filed at 06/13/15 1415  Gross per 24 hour  Intake    100 ml  Output      0 ml  Net    100 ml    LBM: Last BM Date: 06/10/15 Baseline Weight:   Most recent weight:       Palliative Assessment/Data:     Time In: 1500 Time Out: 1630 Time Total: Greater than 50%  of this time was spent counseling and coordinating care related to the above assessment and plan.  Signed by: Ulice Bold, NP   Please contact Palliative Medicine Team phone at [phone] for questions and concerns.

## 2015-06-13 NOTE — Progress Notes (Signed)
CRITICAL VALUE ALERT  Critical value received:  Lactic acid   Date of notification:  06/13/2015  Time of notification:  05:53  Critical value read back: yes  Nurse who received alert:  Heron Sabinsanya Ayen Viviano, RN  MD notified (1st page):  Julian ReilGardner  Time of first page:  06:13  MD notified (2nd page):  Time of second page:  Responding MD:  Julian ReilGardner  Time MD responded:  06:13  Will not do anything at this time.

## 2015-06-13 NOTE — Evaluation (Signed)
Physical Therapy Evaluation Patient Details Name: Richardean SaleDorita Scoggin MRN: [id number] DOB: Jul 23, 1932 Today's Date: 06/13/2015   History of Present Illness  pt presents with episodes of unresponsiveness with Acute Encephalopathy and r/o Seizures.  pt with hx of CVA, Depression, and HTN.    Clinical Impression  Pt is dependent in all aspects of mobility and care.  Per husband's report pt only occasionally participated with feeding herself with her L UE and seemed to visually attend to him more than she presently does, otherwise she is at her baseline level of function.  Pt's husband has been providing total care for the pt at home and clearly needs more supports as it is concerning that he has been dependently lifting her OOB.  He does indicate that they just got a lift, but he has not been educated on how to use it.  Feel pt would also benefit from hospital bed with air overlay mattress as pt is at high risk for skin breakdown.  During PT eval pt only responded to painful stimluli with grimace and slow withdraw.  Pt's husband is adamant about pt returning to home, so [facility].  No further acute PT needs at this time.      Follow Up Recommendations No PT follow up;Supervision/Assistance - 24 hour ([facility])    Equipment Recommendations  Hospital bed (with air overlay.)    Recommendations for Other Services       Precautions / Restrictions Precautions Precautions: Fall Restrictions Weight Bearing Restrictions: No      Mobility  Bed Mobility Overal bed mobility: Needs Assistance;+2 for physical assistance Bed Mobility: Supine to Sit;Sit to Supine     Supine to sit: Total assist;HOB elevated Sit to supine: Total assist;HOB elevated   General bed mobility comments: pt does not participate in bed mobility.  Per husband only difference from baseline is that pt attends to him more and currently only noted visual movement to husband's voice.     Transfers                    Ambulation/Gait                Stairs            Wheelchair Mobility    Modified Rankin (Stroke Patients Only)       Balance Overall balance assessment: Needs assistance Sitting-balance support: Feet supported Sitting balance-Leahy Scale: Zero                                       Pertinent Vitals/Pain Pain Assessment: Faces Faces Pain Scale: Hurts a little bit Pain Location: Grimaces slightly with bed mobility. Pain Descriptors / Indicators: Grimacing Pain Intervention(s): Monitored during session;Repositioned    Home Living Family/patient expects to be discharged to:: Private residence Living Arrangements: Spouse/significant other Available Help at Discharge: Family;Available 24 hours/day (Husband. Daughters check on them daily. Aide 2 days/wk.) Type of Home: House Home Access: Stairs to enter Entrance Stairs-Rails: None Entrance Stairs-Number of Steps: 1 (Only 4" and husband indicates W/C easily goes up it.) Home Layout: One level Home Equipment: Bedside commode;Wheelchair - [employer];Walker - 2 wheels Additional Comments: Per pt's husband, a lift was just delivered and he was supposed to be educated on it today. He also indicates that his daughters are researching hospital beds.      Prior Function Level of Independence: Needs  assistance   Gait / Transfers Assistance Needed: Dependent transfer using transfer disc.  pt has not participated in mobility "for some time now".    ADL's / Homemaking Assistance Needed: Total A for sponge bath in bed and performing all ADLs.  Daughters A with homemaking tasks.          Hand Dominance        Extremity/Trunk Assessment   Upper Extremity Assessment:  (No active movement noted. Pain sensation intact.)           Lower Extremity Assessment:  (No active movement noted. Pain sensation intact.)      Cervical / Trunk Assessment: Kyphotic   Communication   Communication:  (Nonverbal)  Cognition Arousal/Alertness: Lethargic Behavior During Therapy: Flat affect Overall Cognitive Status: History of cognitive impairments - at baseline                      General Comments      Exercises        Assessment/Plan    PT Assessment Patent does not need any further PT services  PT Diagnosis Generalized weakness;Altered mental status   PT Problem List    PT Treatment Interventions     PT Goals (Current goals can be found in the Care Plan section) Acute Rehab PT Goals Patient Stated Goal: Per husband for pt to participate again. PT Goal Formulation: All assessment and education complete, DC therapy    Frequency     Barriers to discharge        Co-evaluation               End of Session   Activity Tolerance: Patient limited by lethargy Patient left: in bed;with call bell/phone within reach;with family/visitor present Nurse Communication: Mobility status;Need for lift equipment    Functional Assessment Tool Used: Clinical Judgement Functional Limitation: Mobility: Walking and moving around Mobility: Walking and Moving Around Current Status [phone]): 100 percent impaired, limited or restricted Mobility: Walking and Moving Around Goal Status [phone]): 100 percent impaired, limited or restricted Mobility: Walking and Moving Around Discharge Status [phone]): 100 percent impaired, limited or restricted    Time: 9147-8295 PT Time Calculation (min) (ACUTE ONLY): 26 min   Charges:   PT Evaluation $PT Eval Moderate Complexity: 1 Procedure PT Treatments $Therapeutic Activity: 8-22 mins   PT G Codes:   PT G-Codes **NOT FOR INPATIENT CLASS** Functional Assessment Tool Used: Clinical Judgement Functional Limitation: Mobility: Walking and moving around Mobility: Walking and Moving Around Current Status (A2130): 100 percent impaired, limited or restricted Mobility: Walking and Moving Around Goal Status  (Q6578): 100 percent impaired, limited or restricted Mobility: Walking and Moving Around Discharge Status (I6962): 100 percent impaired, limited or restricted    Sunny Schlein, [place] [phone] 06/13/2015, 11:09 AM

## 2015-06-13 NOTE — Progress Notes (Addendum)
[facility] PROGRESS NOTE  Carrie Riley ZOX:[id number] DOB: 1932/06/09 DOA: 06/12/2015   PCP: No PCP Per Patient     Assessment/Plan: Principal Problem:   Acute encephalopathy Active Problems:   Altered mental status   Stroke (HCC)   Hyperlipidemia   Depression   Essential hypertension   Aphasia   Encephalopathy  82 y.o. female, With history of large left frontal lobe ischemic CVA in the past, aphasic at baseline, dense right-sided hemiparesis with bedbound status at baseline,presents with transient episode of unresponsiveness. She was being seen by a home health aide who apparently observed that she had sudden episode of staring. He called the husband advised that they called 911. She apparently has had several of these episodes in the past, and was started on Keppra for them one month ago. Patient also has a history of essential hypertension, dyslipidemia, depression, UTI in the past, questionable seizures who lives at home, is bedbound, is aphasic at baseline,.  In the ER patient was found to be hypotensive, initial lab work was unremarkable, UA suspicious for UTI but specimen likely was not of good quality, she is afebrile, with the help of husband limited review of systems was obtained as below but was unremarkable. After receiving some IV fluid in the ER patient is already improving and getting close to her baseline.  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 Tele. 2-D echo to rule out wall motion abnormalities   DVT prophylaxsis   Code Status:  Full code    Family Communication: Discussed in detail with the family, all imaging results, lab results explained to the patient   Disposition Plan:  Palliative care discussion pending, anticipate discharge tomorrow       Consultants:  Neurology  Palliative care    Procedures:  None  Antibiotics: Rocephin      HPI/Subjective: Patient completing a vegetative state and unresponsive  Objective: Filed Vitals:   06/12/15 1700 06/12/15 1730 06/12/15 1851 06/13/15 0500  BP: 94/65 100/71 98/76 108/55  Pulse: 98 97 98 95  Temp:   97.7 F (36.5 C) 98 F (36.7 C)  TempSrc:   Axillary Axillary  Resp: SpO2: 99% 100% 93% 100%   No intake or output data in the 24 hours ending 06/13/15 1303  Exam:  Examination:  General exam: Appears calm and comfortable  Respiratory system: Clear to auscultation. Respiratory effort normal. Cardiovascular system: S1 & S2 heard, RRR. No JVD, murmurs, rubs, gallops or clicks. No pedal edema. Gastrointestinal system: Abdomen is nondistended, soft and nontender. No organomegaly or masses felt. Normal bowel sounds heard. Central nervous system: Alert and oriented. No focal neurological deficits. Extremities: Symmetric 5 x 5 power. Skin: No rashes, lesions or ulcers Psychiatry: Judgement and insight appear normal. Mood &  affect appropriate.     Data Reviewed: I have personally reviewed following labs and imaging studies  Micro Results No results found for this or any previous visit (from the past 240 hour(s)).  Radiology Reports Dg Chest 2  View  06/12/2015  CLINICAL DATA:  Stroke.  Right-sided deficits and aphasia. EXAM: CHEST  2 VIEW COMPARISON:  05/11/2015 FINDINGS: The heart size and mediastinal contours are within normal limits. The pulmonary arteries appear prominent suggestive of PA hypertension. Both lungs are clear. The visualized skeletal structures are unremarkable. IMPRESSION: 1. No acute findings. 2. Prominent pulmonary arteries suggestive of PA hypertension. Electronically Signed   By: Signa Kell M.D.   On: 06/12/2015 16:34   Ct Head Wo Contrast  06/12/2015  CLINICAL DATA:  Altered mental status EXAM: CT HEAD WITHOUT CONTRAST TECHNIQUE: Contiguous axial images were obtained from the base of the skull through the vertex without intravenous contrast. COMPARISON:  05/12/2015 FINDINGS: The bony calvarium is intact. Atrophic changes and chronic white matter ischemic changes are seen similar to that noted on prior MRI examination. No findings to suggest acute hemorrhage, acute infarction or space-occupying mass lesion are noted. IMPRESSION: No acute abnormality noted. Electronically Signed   By: Alcide Clever M.D.   On: 06/12/2015 16:56   Mr Maxine Glenn Head Wo Contrast  06/13/2015  CLINICAL DATA:  Initial evaluation for progressive right-sided weakness and aphasia with episodes of unresponsiveness. EXAM: MRI HEAD WITHOUT CONTRAST MRA HEAD WITHOUT CONTRAST MRA NECK WITHOUT AND WITH CONTRAST TECHNIQUE: Multiplanar, multiecho pulse sequences of the brain and surrounding structures were obtained without intravenous contrast. Angiographic images of the Circle of Willis were obtained using MRA technique without intravenous contrast. Angiographic images of the neck were obtained using MRA technique without and with intravenous contrast. Carotid stenosis measurements (when applicable) are obtained utilizing NASCET criteria, using the distal internal carotid diameter as the denominator. CONTRAST:  10mL MULTIHANCE GADOBENATE DIMEGLUMINE 529 MG/ML IV  SOLN COMPARISON:  Previous CT from 06/12/2015 as well as previous brain MRI from 05/12/2015. FINDINGS: MRI HEAD FINDINGS Study somewhat degraded by motion artifact. Diffuse prominence of the CSF containing spaces compatible with generalized cerebral atrophy, fairly advanced in nature. Patchy T2/FLAIR hyperintensity within the periventricular and deep white matter both cerebral hemispheres most likely related chronic small vessel ischemic disease, moderate nature. Chronic small vessel ischemic type changes present within the pons. Cortical/subcortical encephalomalacia within the parasagittal left frontal lobe consistent with remote infarct, relatively small in nature, and stable relative to previous MRI. No other remote infarcts identified. No abnormal foci of restricted diffusion to suggest acute infarct. Gray-white matter differentiation maintained. Major intracranial vascular flow voids are preserved. No acute intracranial hemorrhage. Small focus susceptibility air that within a left superior cerebellar vermis likely reflects a small chronic micro hemorrhage. No mass lesion, midline shift, or mass effect. Ventricular prominence related to global parenchymal volume loss of hydrocephalus. No extra-axial fluid collection. Major dural sinuses are patent. Craniocervical junction within normal limits. Visualized upper cervical spine unremarkable. Incidental note made of a partially empty sella. No acute abnormality about the orbits. Sequela prior lens extraction on the right. Paranasal sinuses are clear. No mastoid effusion. Inner ear structures normal. Bone marrow signal intensity within normal limits. No scalp soft tissue abnormality. MRA HEAD FINDINGS ANTERIOR CIRCULATION: Visualized distal cervical segments of the internal carotid arteries are patent with antegrade flow. Petrous segments patent. Mild atheromatous irregularity within the cavernous and supraclinoid ICAs without flow-limiting stenosis. ICA termini  widely patent. There is a a  saccular aneurysm at the level of the right posterior communicating artery measuring approximately 8 x 6 mm (series 4, image 36). A1 segments patent. Anterior communicating artery normal. Anterior cerebral arteries are opacified to their distal aspect without definite flow limiting stenosis. Irregularity within the mid A2 segments favored to be related to motion. Right M1 segment patent without stenosis or occlusion. Right MCA bifurcation normal. Distal small vessel atheromatous irregularity within the right MCA branches. The proximal and mid aspect of the left A1 segment are widely patent. Absent signal void within the distal left M1 segment, favored to be artifactual, as a normal flow void is seen within this region on on the T2 weighted sequence of the corresponding brain. Flow was seen distally within the left MCA branches, but demonstrate multifocal atheromatous irregularity. POSTERIOR CIRCULATION: Vertebral arteries patent to the vertebrobasilar junction. Left vertebral artery slightly dominant. Left posterior inferior cerebral artery patent. Right posterior inferior cerebral artery not well visualized. Basilar artery widely patent. Superior cerebellar arteries patent proximally. Left PCA arises from the basilar artery and is well opacified to its distal aspect. Fetal type right PCA supplied via the right posterior communicating artery. Right PCA is attenuated distally, and may be partially occluded. MRA NECK FINDINGS Visualized aortic arch is of normal caliber with normal branch pattern. No high-grade stenosis at the origin of the great vessels. Mild atheromatous narrowing of the proximal left subclavian artery without significant stenosis. Right common carotid artery patent from its origin to the bifurcation. No significant atheromatous narrowing about the right bifurcation. Right ICA widely patent from the bifurcation to the skullbase. No flow limiting stenosis, dissection, or  vascular occlusion within the right carotid artery system. Left common carotid artery widely patent from its origin to the bifurcation. No significant stenosis about the left bifurcation. Left ICA widely patent from the bifurcation to the skullbase. No flow limiting stenosis, dissection, or vascular occlusion within the left carotid artery system. Vertebral arteries both arise from the subclavian arteries. Origin of the vertebral arteries not well seen on this exam. Vertebral arteries widely patent within the neck without stenosis, occlusion, or definite dissection. Left vertebral artery is dominant. IMPRESSION: MRI HEAD IMPRESSION: 1. No acute intracranial infarct or other process identified. 2. Age advanced cerebral atrophy with moderate chronic small vessel ischemic disease. 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 These findings are stable. MRA HEAD IMPRESSION: 1. No large or proximal arterial branch occlusion within the intracranial circulation. No high-grade or correctable stenosis. 2. Attenuation of the distal right PCA, which may be occluded. 3. 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 There is a fetal type right PCA. 4. Distal small vessel irregularity within the intracranial circulation. MRA NECK IMPRESSION: Negative MRA of the neck. No flow limiting or critical stenosis identified. Electronically Signed   By: Rise Mu M.D.   On: 06/13/2015 05:53   Mr Angiogram Neck W Wo Contrast  06/13/2015  CLINICAL DATA:  Initial evaluation for progressive right-sided weakness and aphasia with episodes of unresponsiveness. EXAM: MRI HEAD WITHOUT CONTRAST MRA HEAD WITHOUT CONTRAST MRA NECK WITHOUT AND WITH CONTRAST TECHNIQUE: Multiplanar, multiecho pulse sequences of the brain and surrounding structures were obtained without intravenous contrast. Angiographic images of the Circle of Willis were obtained using MRA technique without intravenous contrast. Angiographic images of  the neck were obtained using MRA technique without and with intravenous contrast. Carotid stenosis measurements (when applicable) are obtained utilizing NASCET criteria, using the distal internal carotid diameter as the denominator. CONTRAST:  10mL MULTIHANCE GADOBENATE DIMEGLUMINE 529 MG/ML IV SOLN COMPARISON:  Previous CT from 06/12/2015 as well as previous brain MRI from 05/12/2015. FINDINGS: MRI HEAD FINDINGS Study somewhat degraded by motion artifact. Diffuse prominence of the CSF containing spaces compatible with generalized cerebral atrophy, fairly advanced in nature. Patchy T2/FLAIR hyperintensity within the periventricular and deep white matter both cerebral hemispheres most likely related chronic small vessel ischemic disease, moderate nature. Chronic small vessel ischemic type changes present within the pons. Cortical/subcortical encephalomalacia within the parasagittal left frontal lobe consistent with remote infarct, relatively small in nature, and stable relative to previous MRI. No other remote infarcts identified. No abnormal foci of restricted diffusion to suggest acute infarct. Gray-white matter differentiation maintained. Major intracranial vascular flow voids are preserved. No acute intracranial hemorrhage. Small focus susceptibility air that within a left superior cerebellar vermis likely reflects a small chronic micro hemorrhage. No mass lesion, midline shift, or mass effect. Ventricular prominence related to global parenchymal volume loss of hydrocephalus. No extra-axial fluid collection. Major dural sinuses are patent. Craniocervical junction within normal limits. Visualized upper cervical spine unremarkable. Incidental note made of a partially empty sella. No acute abnormality about the orbits. Sequela prior lens extraction on the right. Paranasal sinuses are clear. No mastoid effusion. Inner ear structures normal. Bone marrow signal intensity within normal limits. No scalp soft tissue  abnormality. MRA HEAD FINDINGS ANTERIOR CIRCULATION: Visualized distal cervical segments of the internal carotid arteries are patent with antegrade flow. Petrous segments patent. Mild atheromatous irregularity within the cavernous and supraclinoid ICAs without flow-limiting stenosis. ICA termini widely patent. There is a a saccular aneurysm at the level of the right posterior communicating artery measuring approximately 8 x 6 mm (series 4, image 36). A1 segments patent. Anterior communicating artery normal. Anterior cerebral arteries are opacified to their distal aspect without definite flow limiting stenosis. Irregularity within the mid A2 segments favored to be related to motion. Right M1 segment patent without stenosis or occlusion. Right MCA bifurcation normal. Distal small vessel atheromatous irregularity within the right MCA branches. The proximal and mid aspect of the left A1 segment are widely patent. Absent signal void within the distal left M1 segment, favored to be artifactual, as a normal flow void is seen within this region on on the T2 weighted sequence of the corresponding brain. Flow was seen distally within the left MCA branches, but demonstrate multifocal atheromatous irregularity. POSTERIOR CIRCULATION: Vertebral arteries patent to the vertebrobasilar junction. Left vertebral artery slightly dominant. Left posterior inferior cerebral artery patent. Right posterior inferior cerebral artery not well visualized. Basilar artery widely patent. Superior cerebellar arteries patent proximally. Left PCA arises from the basilar artery and is well opacified to its distal aspect. Fetal type right PCA supplied via the right posterior communicating artery. Right PCA is attenuated distally, and may be partially occluded. MRA NECK FINDINGS Visualized aortic arch is of normal caliber with normal branch pattern. No high-grade stenosis at the origin of the great vessels. Mild atheromatous narrowing of the proximal  left subclavian artery without significant stenosis. Right common carotid artery patent from its origin to the bifurcation. No significant atheromatous narrowing about the right bifurcation. Right ICA widely patent from the bifurcation to the skullbase. No flow limiting stenosis, dissection, or vascular occlusion within the right carotid artery system. Left common carotid artery widely patent from its origin to the bifurcation. No significant stenosis about the left bifurcation. Left ICA widely patent from the bifurcation to the skullbase. No flow limiting stenosis, dissection, or vascular  occlusion within the left carotid artery system. Vertebral arteries both arise from the subclavian arteries. Origin of the vertebral arteries not well seen on this exam. Vertebral arteries widely patent within the neck without stenosis, occlusion, or definite dissection. Left vertebral artery is dominant. IMPRESSION: MRI HEAD IMPRESSION: 1. No acute intracranial infarct or other process identified. 2. Age advanced cerebral atrophy with moderate chronic small vessel ischemic disease. 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 These findings are stable. MRA HEAD IMPRESSION: 1. No large or proximal arterial branch occlusion within the intracranial circulation. No high-grade or correctable stenosis. 2. Attenuation of the distal right PCA, which may be occluded. 3. 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 There is a fetal type right PCA. 4. Distal small vessel irregularity within the intracranial circulation. MRA NECK IMPRESSION: Negative MRA of the neck. No flow limiting or critical stenosis identified. Electronically Signed   By: Rise Mu M.D.   On: 06/13/2015 05:53   Mr Brain Wo Contrast  06/13/2015  CLINICAL DATA:  Initial evaluation for progressive right-sided weakness and aphasia with episodes of unresponsiveness. EXAM: MRI HEAD WITHOUT CONTRAST MRA HEAD WITHOUT CONTRAST MRA NECK  WITHOUT AND WITH CONTRAST TECHNIQUE: Multiplanar, multiecho pulse sequences of the brain and surrounding structures were obtained without intravenous contrast. Angiographic images of the Circle of Willis were obtained using MRA technique without intravenous contrast. Angiographic images of the neck were obtained using MRA technique without and with intravenous contrast. Carotid stenosis measurements (when applicable) are obtained utilizing NASCET criteria, using the distal internal carotid diameter as the denominator. CONTRAST:  10mL MULTIHANCE GADOBENATE DIMEGLUMINE 529 MG/ML IV SOLN COMPARISON:  Previous CT from 06/12/2015 as well as previous brain MRI from 05/12/2015. FINDINGS: MRI HEAD FINDINGS Study somewhat degraded by motion artifact. Diffuse prominence of the CSF containing spaces compatible with generalized cerebral atrophy, fairly advanced in nature. Patchy T2/FLAIR hyperintensity within the periventricular and deep white matter both cerebral hemispheres most likely related chronic small vessel ischemic disease, moderate nature. Chronic small vessel ischemic type changes present within the pons. Cortical/subcortical encephalomalacia within the parasagittal left frontal lobe consistent with remote infarct, relatively small in nature, and stable relative to previous MRI. No other remote infarcts identified. No abnormal foci of restricted diffusion to suggest acute infarct. Gray-white matter differentiation maintained. Major intracranial vascular flow voids are preserved. No acute intracranial hemorrhage. Small focus susceptibility air that within a left superior cerebellar vermis likely reflects a small chronic micro hemorrhage. No mass lesion, midline shift, or mass effect. Ventricular prominence related to global parenchymal volume loss of hydrocephalus. No extra-axial fluid collection. Major dural sinuses are patent. Craniocervical junction within normal limits. Visualized upper cervical spine  unremarkable. Incidental note made of a partially empty sella. No acute abnormality about the orbits. Sequela prior lens extraction on the right. Paranasal sinuses are clear. No mastoid effusion. Inner ear structures normal. Bone marrow signal intensity within normal limits. No scalp soft tissue abnormality. MRA HEAD FINDINGS ANTERIOR CIRCULATION: Visualized distal cervical segments of the internal carotid arteries are patent with antegrade flow. Petrous segments patent. Mild atheromatous irregularity within the cavernous and supraclinoid ICAs without flow-limiting stenosis. ICA termini widely patent. There is a a saccular aneurysm at the level of the right posterior communicating artery measuring approximately 8 x 6 mm (series 4, image 36). A1 segments patent. Anterior communicating artery normal. Anterior cerebral arteries are opacified to their distal aspect without definite flow limiting stenosis. Irregularity within the mid A2 segments favored to be related  to motion. Right M1 segment patent without stenosis or occlusion. Right MCA bifurcation normal. Distal small vessel atheromatous irregularity within the right MCA branches. The proximal and mid aspect of the left A1 segment are widely patent. Absent signal void within the distal left M1 segment, favored to be artifactual, as a normal flow void is seen within this region on on the T2 weighted sequence of the corresponding brain. Flow was seen distally within the left MCA branches, but demonstrate multifocal atheromatous irregularity. POSTERIOR CIRCULATION: Vertebral arteries patent to the vertebrobasilar junction. Left vertebral artery slightly dominant. Left posterior inferior cerebral artery patent. Right posterior inferior cerebral artery not well visualized. Basilar artery widely patent. Superior cerebellar arteries patent proximally. Left PCA arises from the basilar artery and is well opacified to its distal aspect. Fetal type right PCA supplied via the  right posterior communicating artery. Right PCA is attenuated distally, and may be partially occluded. MRA NECK FINDINGS Visualized aortic arch is of normal caliber with normal branch pattern. No high-grade stenosis at the origin of the great vessels. Mild atheromatous narrowing of the proximal left subclavian artery without significant stenosis. Right common carotid artery patent from its origin to the bifurcation. No significant atheromatous narrowing about the right bifurcation. Right ICA widely patent from the bifurcation to the skullbase. No flow limiting stenosis, dissection, or vascular occlusion within the right carotid artery system. Left common carotid artery widely patent from its origin to the bifurcation. No significant stenosis about the left bifurcation. Left ICA widely patent from the bifurcation to the skullbase. No flow limiting stenosis, dissection, or vascular occlusion within the left carotid artery system. Vertebral arteries both arise from the subclavian arteries. Origin of the vertebral arteries not well seen on this exam. Vertebral arteries widely patent within the neck without stenosis, occlusion, or definite dissection. Left vertebral artery is dominant. IMPRESSION: MRI HEAD IMPRESSION: 1. No acute intracranial infarct or other process identified. 2. Age advanced cerebral atrophy with moderate chronic small vessel ischemic disease. 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 These findings are stable. MRA HEAD IMPRESSION: 1. No large or proximal arterial branch occlusion within the intracranial circulation. No high-grade or correctable stenosis. 2. Attenuation of the distal right PCA, which may be occluded. 3. 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 There is a fetal type right PCA. 4. Distal small vessel irregularity within the intracranial circulation. MRA NECK IMPRESSION: Negative MRA of the neck. No flow limiting or critical stenosis identified.  Electronically Signed   By: Rise Mu M.D.   On: 06/13/2015 05:53     CBC  Recent Labs Lab 06/12/15 1600 06/13/15 0518  WBC 6.3 4.7  HGB 12.6 11.9*  HCT 38.9 36.0  PLT 222 203  MCV 92.0 91.8  MCH 29.8 30.4  MCHC 32.4 33.1  RDW 13.7 13.9  LYMPHSABS 2.3  --   MONOABS 0.5  --   EOSABS 0.1  --   BASOSABS 0.0  --     Chemistries   Recent Labs Lab 06/12/15 1600 06/13/15 0518  NA 144 145  K 3.8 4.0  CL 112* 115*  CO2 21* 17*  GLUCOSE 115* 78  BUN 21* 18  CREATININE 0.88 0.62  CALCIUM 9.3 8.6*  AST 18  --   ALT 17  --   ALKPHOS 56  --   BILITOT 0.7  --    ------------------------------------------------------------------------------------------------------------------ CrCl cannot be calculated (Unknown ideal weight.). ------------------------------------------------------------------------------------------------------------------ No results for input(s): HGBA1C in the last 72 hours. ------------------------------------------------------------------------------------------------------------------  No results for input(s): CHOL, HDL, LDLCALC, TRIG, CHOLHDL, LDLDIRECT in the last 72 hours. ------------------------------------------------------------------------------------------------------------------  Recent Labs  06/12/15 2000  TSH 2.838   ------------------------------------------------------------------------------------------------------------------ No results for input(s): VITAMINB12, FOLATE, FERRITIN, TIBC, IRON, RETICCTPCT in the last 72 hours.  Coagulation profile  Recent Labs Lab 06/12/15 2000  INR 1.32    No results for input(s): DDIMER in the last 72 hours.  Cardiac Enzymes  Recent Labs Lab 06/12/15 1735 06/12/15 2340 06/13/15 0518  TROPONINI 0.29* 0.23* 0.14*   ------------------------------------------------------------------------------------------------------------------ Invalid input(s): POCBNP   CBG: No results for  input(s): GLUCAP in the last 168 hours.     Studies: Dg Chest 2 View  06/12/2015  CLINICAL DATA:  Stroke.  Right-sided deficits and aphasia. EXAM: CHEST  2 VIEW COMPARISON:  05/11/2015 FINDINGS: The heart size and mediastinal contours are within normal limits. The pulmonary arteries appear prominent suggestive of PA hypertension. Both lungs are clear. The visualized skeletal structures are unremarkable. IMPRESSION: 1. No acute findings. 2. Prominent pulmonary arteries suggestive of PA hypertension. Electronically Signed   By: Signa Kell M.D.   On: 06/12/2015 16:34   Ct Head Wo Contrast  06/12/2015  CLINICAL DATA:  Altered mental status EXAM: CT HEAD WITHOUT CONTRAST TECHNIQUE: Contiguous axial images were obtained from the base of the skull through the vertex without intravenous contrast. COMPARISON:  05/12/2015 FINDINGS: The bony calvarium is intact. Atrophic changes and chronic white matter ischemic changes are seen similar to that noted on prior MRI examination. No findings to suggest acute hemorrhage, acute infarction or space-occupying mass lesion are noted. IMPRESSION: No acute abnormality noted. Electronically Signed   By: Alcide Clever M.D.   On: 06/12/2015 16:56   Mr Maxine Glenn Head Wo Contrast  06/13/2015  CLINICAL DATA:  Initial evaluation for progressive right-sided weakness and aphasia with episodes of unresponsiveness. EXAM: MRI HEAD WITHOUT CONTRAST MRA HEAD WITHOUT CONTRAST MRA NECK WITHOUT AND WITH CONTRAST TECHNIQUE: Multiplanar, multiecho pulse sequences of the brain and surrounding structures were obtained without intravenous contrast. Angiographic images of the Circle of Willis were obtained using MRA technique without intravenous contrast. Angiographic images of the neck were obtained using MRA technique without and with intravenous contrast. Carotid stenosis measurements (when applicable) are obtained utilizing NASCET criteria, using the distal internal carotid diameter as the  denominator. CONTRAST:  10mL MULTIHANCE GADOBENATE DIMEGLUMINE 529 MG/ML IV SOLN COMPARISON:  Previous CT from 06/12/2015 as well as previous brain MRI from 05/12/2015. FINDINGS: MRI HEAD FINDINGS Study somewhat degraded by motion artifact. Diffuse prominence of the CSF containing spaces compatible with generalized cerebral atrophy, fairly advanced in nature. Patchy T2/FLAIR hyperintensity within the periventricular and deep white matter both cerebral hemispheres most likely related chronic small vessel ischemic disease, moderate nature. Chronic small vessel ischemic type changes present within the pons. Cortical/subcortical encephalomalacia within the parasagittal left frontal lobe consistent with remote infarct, relatively small in nature, and stable relative to previous MRI. No other remote infarcts identified. No abnormal foci of restricted diffusion to suggest acute infarct. Gray-white matter differentiation maintained. Major intracranial vascular flow voids are preserved. No acute intracranial hemorrhage. Small focus susceptibility air that within a left superior cerebellar vermis likely reflects a small chronic micro hemorrhage. No mass lesion, midline shift, or mass effect. Ventricular prominence related to global parenchymal volume loss of hydrocephalus. No extra-axial fluid collection. Major dural sinuses are patent. Craniocervical junction within normal limits. Visualized upper cervical spine unremarkable. Incidental note made of a partially empty sella. No acute abnormality about the orbits.  Sequela prior lens extraction on the right. Paranasal sinuses are clear. No mastoid effusion. Inner ear structures normal. Bone marrow signal intensity within normal limits. No scalp soft tissue abnormality. MRA HEAD FINDINGS ANTERIOR CIRCULATION: Visualized distal cervical segments of the internal carotid arteries are patent with antegrade flow. Petrous segments patent. Mild atheromatous irregularity within the  cavernous and supraclinoid ICAs without flow-limiting stenosis. ICA termini widely patent. There is a a saccular aneurysm at the level of the right posterior communicating artery measuring approximately 8 x 6 mm (series 4, image 36). A1 segments patent. Anterior communicating artery normal. Anterior cerebral arteries are opacified to their distal aspect without definite flow limiting stenosis. Irregularity within the mid A2 segments favored to be related to motion. Right M1 segment patent without stenosis or occlusion. Right MCA bifurcation normal. Distal small vessel atheromatous irregularity within the right MCA branches. The proximal and mid aspect of the left A1 segment are widely patent. Absent signal void within the distal left M1 segment, favored to be artifactual, as a normal flow void is seen within this region on on the T2 weighted sequence of the corresponding brain. Flow was seen distally within the left MCA branches, but demonstrate multifocal atheromatous irregularity. POSTERIOR CIRCULATION: Vertebral arteries patent to the vertebrobasilar junction. Left vertebral artery slightly dominant. Left posterior inferior cerebral artery patent. Right posterior inferior cerebral artery not well visualized. Basilar artery widely patent. Superior cerebellar arteries patent proximally. Left PCA arises from the basilar artery and is well opacified to its distal aspect. Fetal type right PCA supplied via the right posterior communicating artery. Right PCA is attenuated distally, and may be partially occluded. MRA NECK FINDINGS Visualized aortic arch is of normal caliber with normal branch pattern. No high-grade stenosis at the origin of the great vessels. Mild atheromatous narrowing of the proximal left subclavian artery without significant stenosis. Right common carotid artery patent from its origin to the bifurcation. No significant atheromatous narrowing about the right bifurcation. Right ICA widely patent from  the bifurcation to the skullbase. No flow limiting stenosis, dissection, or vascular occlusion within the right carotid artery system. Left common carotid artery widely patent from its origin to the bifurcation. No significant stenosis about the left bifurcation. Left ICA widely patent from the bifurcation to the skullbase. No flow limiting stenosis, dissection, or vascular occlusion within the left carotid artery system. Vertebral arteries both arise from the subclavian arteries. Origin of the vertebral arteries not well seen on this exam. Vertebral arteries widely patent within the neck without stenosis, occlusion, or definite dissection. Left vertebral artery is dominant. IMPRESSION: MRI HEAD IMPRESSION: 1. No acute intracranial infarct or other process identified. 2. Age advanced cerebral atrophy with moderate chronic small vessel ischemic disease. 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 These findings are stable. MRA HEAD IMPRESSION: 1. No large or proximal arterial branch occlusion within the intracranial circulation. No high-grade or correctable stenosis. 2. Attenuation of the distal right PCA, which may be occluded. 3. 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 There is a fetal type right PCA. 4. Distal small vessel irregularity within the intracranial circulation. MRA NECK IMPRESSION: Negative MRA of the neck. No flow limiting or critical stenosis identified. Electronically Signed   By: Rise Mu M.D.   On: 06/13/2015 05:53   Mr Angiogram Neck W Wo Contrast  06/13/2015  CLINICAL DATA:  Initial evaluation for progressive right-sided weakness and aphasia with episodes of unresponsiveness. EXAM: MRI HEAD WITHOUT CONTRAST MRA HEAD WITHOUT CONTRAST MRA  NECK WITHOUT AND WITH CONTRAST TECHNIQUE: Multiplanar, multiecho pulse sequences of the brain and surrounding structures were obtained without intravenous contrast. Angiographic images of the Circle of Willis were  obtained using MRA technique without intravenous contrast. Angiographic images of the neck were obtained using MRA technique without and with intravenous contrast. Carotid stenosis measurements (when applicable) are obtained utilizing NASCET criteria, using the distal internal carotid diameter as the denominator. CONTRAST:  10mL MULTIHANCE GADOBENATE DIMEGLUMINE 529 MG/ML IV SOLN COMPARISON:  Previous CT from 06/12/2015 as well as previous brain MRI from 05/12/2015. FINDINGS: MRI HEAD FINDINGS Study somewhat degraded by motion artifact. Diffuse prominence of the CSF containing spaces compatible with generalized cerebral atrophy, fairly advanced in nature. Patchy T2/FLAIR hyperintensity within the periventricular and deep white matter both cerebral hemispheres most likely related chronic small vessel ischemic disease, moderate nature. Chronic small vessel ischemic type changes present within the pons. Cortical/subcortical encephalomalacia within the parasagittal left frontal lobe consistent with remote infarct, relatively small in nature, and stable relative to previous MRI. No other remote infarcts identified. No abnormal foci of restricted diffusion to suggest acute infarct. Gray-white matter differentiation maintained. Major intracranial vascular flow voids are preserved. No acute intracranial hemorrhage. Small focus susceptibility air that within a left superior cerebellar vermis likely reflects a small chronic micro hemorrhage. No mass lesion, midline shift, or mass effect. Ventricular prominence related to global parenchymal volume loss of hydrocephalus. No extra-axial fluid collection. Major dural sinuses are patent. Craniocervical junction within normal limits. Visualized upper cervical spine unremarkable. Incidental note made of a partially empty sella. No acute abnormality about the orbits. Sequela prior lens extraction on the right. Paranasal sinuses are clear. No mastoid effusion. Inner ear structures  normal. Bone marrow signal intensity within normal limits. No scalp soft tissue abnormality. MRA HEAD FINDINGS ANTERIOR CIRCULATION: Visualized distal cervical segments of the internal carotid arteries are patent with antegrade flow. Petrous segments patent. Mild atheromatous irregularity within the cavernous and supraclinoid ICAs without flow-limiting stenosis. ICA termini widely patent. There is a a saccular aneurysm at the level of the right posterior communicating artery measuring approximately 8 x 6 mm (series 4, image 36). A1 segments patent. Anterior communicating artery normal. Anterior cerebral arteries are opacified to their distal aspect without definite flow limiting stenosis. Irregularity within the mid A2 segments favored to be related to motion. Right M1 segment patent without stenosis or occlusion. Right MCA bifurcation normal. Distal small vessel atheromatous irregularity within the right MCA branches. The proximal and mid aspect of the left A1 segment are widely patent. Absent signal void within the distal left M1 segment, favored to be artifactual, as a normal flow void is seen within this region on on the T2 weighted sequence of the corresponding brain. Flow was seen distally within the left MCA branches, but demonstrate multifocal atheromatous irregularity. POSTERIOR CIRCULATION: Vertebral arteries patent to the vertebrobasilar junction. Left vertebral artery slightly dominant. Left posterior inferior cerebral artery patent. Right posterior inferior cerebral artery not well visualized. Basilar artery widely patent. Superior cerebellar arteries patent proximally. Left PCA arises from the basilar artery and is well opacified to its distal aspect. Fetal type right PCA supplied via the right posterior communicating artery. Right PCA is attenuated distally, and may be partially occluded. MRA NECK FINDINGS Visualized aortic arch is of normal caliber with normal branch pattern. No high-grade stenosis  at the origin of the great vessels. Mild atheromatous narrowing of the proximal left subclavian artery without significant stenosis. Right common carotid artery patent from  its origin to the bifurcation. No significant atheromatous narrowing about the right bifurcation. Right ICA widely patent from the bifurcation to the skullbase. No flow limiting stenosis, dissection, or vascular occlusion within the right carotid artery system. Left common carotid artery widely patent from its origin to the bifurcation. No significant stenosis about the left bifurcation. Left ICA widely patent from the bifurcation to the skullbase. No flow limiting stenosis, dissection, or vascular occlusion within the left carotid artery system. Vertebral arteries both arise from the subclavian arteries. Origin of the vertebral arteries not well seen on this exam. Vertebral arteries widely patent within the neck without stenosis, occlusion, or definite dissection. Left vertebral artery is dominant. IMPRESSION: MRI HEAD IMPRESSION: 1. No acute intracranial infarct or other process identified. 2. Age advanced cerebral atrophy with moderate chronic small vessel ischemic disease. 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 These findings are stable. MRA HEAD IMPRESSION: 1. No large or proximal arterial branch occlusion within the intracranial circulation. No high-grade or correctable stenosis. 2. Attenuation of the distal right PCA, which may be occluded. 3. 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 There is a fetal type right PCA. 4. Distal small vessel irregularity within the intracranial circulation. MRA NECK IMPRESSION: Negative MRA of the neck. No flow limiting or critical stenosis identified. Electronically Signed   By: Rise Mu M.D.   On: 06/13/2015 05:53   Mr Brain Wo Contrast  06/13/2015  CLINICAL DATA:  Initial evaluation for progressive right-sided weakness and aphasia with episodes of  unresponsiveness. EXAM: MRI HEAD WITHOUT CONTRAST MRA HEAD WITHOUT CONTRAST MRA NECK WITHOUT AND WITH CONTRAST TECHNIQUE: Multiplanar, multiecho pulse sequences of the brain and surrounding structures were obtained without intravenous contrast. Angiographic images of the Circle of Willis were obtained using MRA technique without intravenous contrast. Angiographic images of the neck were obtained using MRA technique without and with intravenous contrast. Carotid stenosis measurements (when applicable) are obtained utilizing NASCET criteria, using the distal internal carotid diameter as the denominator. CONTRAST:  10mL MULTIHANCE GADOBENATE DIMEGLUMINE 529 MG/ML IV SOLN COMPARISON:  Previous CT from 06/12/2015 as well as previous brain MRI from 05/12/2015. FINDINGS: MRI HEAD FINDINGS Study somewhat degraded by motion artifact. Diffuse prominence of the CSF containing spaces compatible with generalized cerebral atrophy, fairly advanced in nature. Patchy T2/FLAIR hyperintensity within the periventricular and deep white matter both cerebral hemispheres most likely related chronic small vessel ischemic disease, moderate nature. Chronic small vessel ischemic type changes present within the pons. Cortical/subcortical encephalomalacia within the parasagittal left frontal lobe consistent with remote infarct, relatively small in nature, and stable relative to previous MRI. No other remote infarcts identified. No abnormal foci of restricted diffusion to suggest acute infarct. Gray-white matter differentiation maintained. Major intracranial vascular flow voids are preserved. No acute intracranial hemorrhage. Small focus susceptibility air that within a left superior cerebellar vermis likely reflects a small chronic micro hemorrhage. No mass lesion, midline shift, or mass effect. Ventricular prominence related to global parenchymal volume loss of hydrocephalus. No extra-axial fluid collection. Major dural sinuses are patent.  Craniocervical junction within normal limits. Visualized upper cervical spine unremarkable. Incidental note made of a partially empty sella. No acute abnormality about the orbits. Sequela prior lens extraction on the right. Paranasal sinuses are clear. No mastoid effusion. Inner ear structures normal. Bone marrow signal intensity within normal limits. No scalp soft tissue abnormality. MRA HEAD FINDINGS ANTERIOR CIRCULATION: Visualized distal cervical segments of the internal carotid arteries are patent with antegrade flow. Petrous segments  patent. Mild atheromatous irregularity within the cavernous and supraclinoid ICAs without flow-limiting stenosis. ICA termini widely patent. There is a a saccular aneurysm at the level of the right posterior communicating artery measuring approximately 8 x 6 mm (series 4, image 36). A1 segments patent. Anterior communicating artery normal. Anterior cerebral arteries are opacified to their distal aspect without definite flow limiting stenosis. Irregularity within the mid A2 segments favored to be related to motion. Right M1 segment patent without stenosis or occlusion. Right MCA bifurcation normal. Distal small vessel atheromatous irregularity within the right MCA branches. The proximal and mid aspect of the left A1 segment are widely patent. Absent signal void within the distal left M1 segment, favored to be artifactual, as a normal flow void is seen within this region on on the T2 weighted sequence of the corresponding brain. Flow was seen distally within the left MCA branches, but demonstrate multifocal atheromatous irregularity. POSTERIOR CIRCULATION: Vertebral arteries patent to the vertebrobasilar junction. Left vertebral artery slightly dominant. Left posterior inferior cerebral artery patent. Right posterior inferior cerebral artery not well visualized. Basilar artery widely patent. Superior cerebellar arteries patent proximally. Left PCA arises from the basilar artery and  is well opacified to its distal aspect. Fetal type right PCA supplied via the right posterior communicating artery. Right PCA is attenuated distally, and may be partially occluded. MRA NECK FINDINGS Visualized aortic arch is of normal caliber with normal branch pattern. No high-grade stenosis at the origin of the great vessels. Mild atheromatous narrowing of the proximal left subclavian artery without significant stenosis. Right common carotid artery patent from its origin to the bifurcation. No significant atheromatous narrowing about the right bifurcation. Right ICA widely patent from the bifurcation to the skullbase. No flow limiting stenosis, dissection, or vascular occlusion within the right carotid artery system. Left common carotid artery widely patent from its origin to the bifurcation. No significant stenosis about the left bifurcation. Left ICA widely patent from the bifurcation to the skullbase. No flow limiting stenosis, dissection, or vascular occlusion within the left carotid artery system. Vertebral arteries both arise from the subclavian arteries. Origin of the vertebral arteries not well seen on this exam. Vertebral arteries widely patent within the neck without stenosis, occlusion, or definite dissection. Left vertebral artery is dominant. IMPRESSION: MRI HEAD IMPRESSION: 1. No acute intracranial infarct or other process identified. 2. Age advanced cerebral atrophy with moderate chronic small vessel ischemic disease. 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 These findings are stable. MRA HEAD IMPRESSION: 1. No large or proximal arterial branch occlusion within the intracranial circulation. No high-grade or correctable stenosis. 2. Attenuation of the distal right PCA, which may be occluded. 3. 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 There is a fetal type right PCA. 4. Distal small vessel irregularity within the intracranial circulation. MRA NECK IMPRESSION: Negative  MRA of the neck. No flow limiting or critical stenosis identified. Electronically Signed   By: Rise Mu M.D.   On: 06/13/2015 05:53      No results found for: HGBA1C Lab Results  Component Value Date   CREATININE 0.62 06/13/2015       Scheduled Meds: . aspirin  325 mg Oral Daily  . baclofen  5 mg Oral QHS  . cefTRIAXone (ROCEPHIN)  IV  1 g Intravenous Q24H  . docusate sodium  100 mg Oral BID  . heparin  5,000 Units Subcutaneous Q8H  . levETIRAcetam  750 mg Oral BID  . pravastatin  10 mg Oral  [id number]  . sodium chloride flush  3 mL Intravenous Q12H   Continuous Infusions: . sodium chloride 75 mL/hr at 06/13/15 1138        Time spent: >30 MINS    [facility]  [facility] Pager [phone]. If 7PM-7AM, please contact night-coverage at [email or web address], password [facility] 06/13/2015, 1:03 PM

## 2015-06-13 NOTE — Progress Notes (Signed)
Patient lying in bed, moaning. Warm heat applied to left leg, appears to possibly be having muscle spasms. Will page physician, husband present at bedside.

## 2015-06-14 ENCOUNTER — Inpatient Hospital Stay (HOSPITAL_COMMUNITY): Payer: Medicare Other

## 2015-06-14 DIAGNOSIS — R400 Somnolence: Secondary | ICD-10-CM

## 2015-06-14 LAB — COMPREHENSIVE METABOLIC PANEL
ALK PHOS: 45 U/L (ref 38–126)
ALK PHOS: 43 U/L (ref 38–126)
ALT: 11 U/L — ABNORMAL LOW (ref 14–54)
ALT: 11 U/L — ABNORMAL LOW (ref 14–54)
AST: 18 U/L (ref 15–41)
AST: 13 U/L — ABNORMAL LOW (ref 15–41)
Albumin: 2.3 g/dL — ABNORMAL LOW (ref 3.5–5.0)
Albumin: 2.2 g/dL — ABNORMAL LOW (ref 3.5–5.0)
Anion gap: 10 (ref 5–15)
Anion gap: 10 (ref 5–15)
BILIRUBIN TOTAL: 0.7 mg/dL (ref 0.3–1.2)
BUN: 16 mg/dL (ref 6–20)
BUN: 16 mg/dL (ref 6–20)
CALCIUM: 8.5 mg/dL — AB (ref 8.9–10.3)
CALCIUM: 8.5 mg/dL — AB (ref 8.9–10.3)
CO2: 16 mmol/L — ABNORMAL LOW (ref 22–32)
CO2: 18 mmol/L — AB (ref 22–32)
CREATININE: 0.63 mg/dL (ref 0.44–1.00)
Chloride: 119 mmol/L — ABNORMAL HIGH (ref 101–111)
Chloride: 119 mmol/L — ABNORMAL HIGH (ref 101–111)
Creatinine, Ser: 0.67 mg/dL (ref 0.44–1.00)
GFR calc Af Amer: >60 mL/min (ref >60)
GFR calc non Af Amer: >60 mL/min (ref >60)
GLUCOSE: 66 mg/dL (ref 65–99)
Glucose, Bld: 67 mg/dL (ref 65–99)
POTASSIUM: 4.3 mmol/L (ref 3.5–5.1)
Potassium: 3.5 mmol/L (ref 3.5–5.1)
SODIUM: 147 mmol/L — AB (ref 135–145)
Sodium: 145 mmol/L (ref 135–145)
TOTAL PROTEIN: 5.4 g/dL — AB (ref 6.5–8.1)
Total Bilirubin: 0.6 mg/dL (ref 0.3–1.2)
Total Protein: 5.4 g/dL — ABNORMAL LOW (ref 6.5–8.1)

## 2015-06-14 MED ORDER — LEVETIRACETAM 500 MG/5ML IV SOLN
750.0000 mg | Freq: Two times a day (BID) | INTRAVENOUS | Status: DC
  Administered 2015-06-14 – 2015-06-16 (×6): 750 mg via INTRAVENOUS
  Filled 2015-06-14 (×8): qty 7.5

## 2015-06-14 MED ORDER — METOPROLOL TARTRATE 5 MG/5ML IV SOLN
5.0000 mg | INTRAVENOUS | Status: DC | PRN
  Administered 2015-06-15: 5 mg via INTRAVENOUS

## 2015-06-14 MED ORDER — DEXTROSE-NACL 5-0.45 % IV SOLN
INTRAVENOUS | Status: DC
  Administered 2015-06-14: 11:00:00 via INTRAVENOUS
  Administered 2015-06-15: 75 mL/h via INTRAVENOUS

## 2015-06-14 MED ORDER — ACETAMINOPHEN 325 MG PO TABS: 650.0000 mg | ORAL_TABLET | Freq: Three times a day (TID) | ORAL | Status: DC

## 2015-06-14 MED ORDER — FENTANYL CITRATE (PF) 100 MCG/2ML IJ SOLN
25.0000 ug | INTRAMUSCULAR | Status: DC | PRN
  Administered 2015-06-15 – 2015-06-16 (×4): 25 ug via INTRAVENOUS
  Filled 2015-06-14 (×4): qty 2

## 2015-06-14 MED ORDER — SODIUM CHLORIDE 0.9 % IV SOLN: INTRAVENOUS | Status: DC

## 2015-06-14 NOTE — Progress Notes (Signed)
Arrived to [facility] [place] to obtain consent for PICC placement. Patient unable to cooperate with verbal instruction or hold still enough for sterile field per Day shift primary RN. Procedure will require 2 PICC RN's. Unable to place at this time. Primary RN aware.

## 2015-06-14 NOTE — Progress Notes (Signed)
[facility] Felicity CoyerKelly R.- regarding [facility]- [facility]- [facility] due to tx being a family/pt request and not a medical necessity. Also pt's coverage for [facility] stay [facility] will not be known until [facility] and her case is reviewed for her stay there- if she does not meet medical criteria for acute care her stay may not be covered [facility] but this will not be known until [facility] and [facility]  Family could be responsible for transfer and stay cost if she does not meet medical necessity [facility].

## 2015-06-14 NOTE — Progress Notes (Signed)
Husband at pts bedside. States there are no current needs. Will continue to monitor.
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
## 2015-06-14 NOTE — Progress Notes (Addendum)
[facility] PROGRESS NOTE  Carrie Riley ZOX:[id number] DOB: [birth year] DOA: 06/12/2015   PCP: No PCP Per Patient     Assessment/Plan: Principal Problem:   Acute encephalopathy Active Problems:   Altered mental status   Stroke (HCC)   Hyperlipidemia   Depression   Essential hypertension   Aphasia   Encephalopathy   Palliative care encounter   Cerebrovascular accident (CVA) due to thrombosis of precerebral artery (HCC)    Brief summary 82 y.o. female, With history of large left frontal lobe ischemic CVA in the past, aphasic at baseline, dense right-sided hemiparesis with bedbound status at baseline,presents with transient episode of unresponsiveness. She was being seen by a home health aide who apparently observed that she had sudden episode of staring. He called the husband advised that they called 911. She apparently has had several of these episodes in the past, and was started on Keppra for them one month ago. Patient also has a history of essential hypertension, dyslipidemia, depression, UTI in the past, questionable seizures who lives at home, is bedbound, is aphasic at baseline,.  In the ER patient was found to be hypotensive, initial lab work was unremarkable, UA suspicious for UTI but specimen likely was not of good quality, she is afebrile, with the help of husband limited review of systems was obtained as below but was unremarkable. After receiving some IV fluid in the ER patient is already improving and getting close to her baseline.  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 Chest x-ray negative TSH normal, UA negative Physical therapy  evaluation-recommends hospital bed Continue to hold Neurontin, trazodone, continue low-dose baclofen to prevent withdrawal, however patient not taking by mouth medications Patient has previously refused all interventions when seen by her neurologist 09/13/14 ,[name], MD  Possible UTI-continue Rocephin pending urine culture results, unable to take oral medications  Dyslipidemia-continue statin  History of CVA-, continue nothing by mouth, palliative care consult, Start seroquel Bing Matter for agitation Unable to take by mouth or IV medications at this point Will request interventional radiology to see the patient is a candidate for PEG tube placement.  History of seizures-continue Keppra iv ,  Mild Non specific Trop rise - EKG non acute, denies Chest pain, Trend Trop, ASA-Statin to continue. Tele. 2-D echo to rule out wall motion abnormalities  Tachycardia once PICC placed will start IV metoprolol  DVT prophylaxsis heparin  Code Status:  Full code    Family Communication: Discussed in detail with the family, all imaging results, lab results explained to the patient   Disposition Plan:  Palliative care discussion pending, feeding tube issues needs to be addressed, continue ongoing discussions, patient lost her IV, discussed with husband would like to restrain her indefinitely to put a feeding tube. Patient has no medical indication to transfer to another facility. We have capability of placing PEG [consult requested] we have capability of placing a PICC line line.  Called [facility] AND SPOKE TO DR Gevena Mart , they have to discuss with CMO and decide if the patient will be accepted,they have my direct cell ph no,patient also needs to be stabilised before transfer , HR up in the 120's  Vira Browns at [phone] was updated with this plan of care      Consultants:  Neurology  Palliative care    Procedures:  None  Antibiotics:  Rocephin      HPI/Subjective: Patient moaning and groaning in pain, pulled out IV twice last night  Objective: Filed Vitals:   06/12/15 1851 06/13/15 0500 06/13/15 1415 06/14/15 0530  BP: 98/76 108/55 123/77 160/91  Pulse: 98 95 98 123  Temp: 97.7 F (36.5 C) 98 F (36.7 C)  98.4 F (36.9 C)  TempSrc: Axillary Axillary  Axillary  Resp: 16 20 19 20   SpO2: 93% 100% 96% 100%    Intake/Output Summary (Last 24 hours) at 06/14/15 0818 Last data filed at 06/13/15 1415  Gross per 24 hour  Intake    100 ml  Output      0 ml  Net    100 ml    Exam:  Examination:  General exam: Very uncomfortable, moaning and groaning in pain Respiratory system: Clear to auscultation. Respiratory effort normal. Cardiovascular system: S1 & S2 heard, RRR. No JVD, murmurs, rubs, gallops or clicks. No pedal edema. Gastrointestinal system: Abdomen is nondistended, soft and nontender. No organomegaly or masses felt. Normal bowel sounds heard. Central nervous system: Alert and oriented. No focal neurological deficits. Extremities: Symmetric 5 x 5 power. Skin: No rashes, lesions or ulcers Psychiatry: Judgement and insight appear normal. Mood & affect appropriate.     Data Reviewed: I have personally reviewed following labs and imaging studies  Micro Results No results found for this or any previous visit (from the past 240 hour(s)).  Radiology Reports Dg Chest 2 View  06/12/2015  CLINICAL DATA:  Stroke.  Right-sided deficits and aphasia. EXAM: CHEST  2 VIEW COMPARISON:  05/11/2015 FINDINGS: The heart size and mediastinal contours are within normal limits. The pulmonary arteries appear prominent suggestive of PA hypertension. Both lungs are clear. The visualized skeletal structures are unremarkable. IMPRESSION: 1. No acute findings. 2. Prominent pulmonary arteries suggestive of PA hypertension. Electronically Signed   By: Signa Kell M.D.   On: 06/12/2015 16:34   Ct Head Wo Contrast  06/12/2015   CLINICAL DATA:  Altered mental status EXAM: CT HEAD WITHOUT CONTRAST TECHNIQUE: Contiguous axial images were obtained from the base of the skull through the vertex without intravenous contrast. COMPARISON:  05/12/2015 FINDINGS: The bony calvarium is intact. Atrophic changes and chronic white matter ischemic changes are seen similar to that noted on prior MRI examination. No findings to suggest acute hemorrhage, acute infarction or space-occupying mass lesion are noted. IMPRESSION: No acute abnormality noted. Electronically Signed   By: Alcide Clever M.D.   On: 06/12/2015 16:56   Mr Maxine Glenn Head Wo Contrast  06/13/2015  CLINICAL DATA:  Initial evaluation for progressive right-sided weakness and aphasia with episodes of unresponsiveness. EXAM: MRI HEAD WITHOUT CONTRAST MRA HEAD WITHOUT CONTRAST MRA NECK WITHOUT AND WITH CONTRAST TECHNIQUE: Multiplanar, multiecho pulse sequences of the brain and surrounding structures were obtained without intravenous contrast. Angiographic images of the Circle of Willis were obtained using MRA technique without intravenous contrast. Angiographic images of the neck were obtained using MRA technique without and with intravenous contrast. Carotid stenosis measurements (when applicable) are obtained utilizing NASCET criteria, using the distal internal carotid diameter as the denominator. CONTRAST:  10mL MULTIHANCE GADOBENATE DIMEGLUMINE 529 MG/ML IV SOLN COMPARISON:  Previous CT from 06/12/2015 as well as previous brain MRI from 05/12/2015. FINDINGS: MRI HEAD FINDINGS Study somewhat degraded by motion artifact. Diffuse prominence  of the CSF containing spaces compatible with generalized cerebral atrophy, fairly advanced in nature. Patchy T2/FLAIR hyperintensity within the periventricular and deep white matter both cerebral hemispheres most likely related chronic small vessel ischemic disease, moderate nature. Chronic small vessel ischemic type changes present within the pons.  Cortical/subcortical encephalomalacia within the parasagittal left frontal lobe consistent with remote infarct, relatively small in nature, and stable relative to previous MRI. No other remote infarcts identified. No abnormal foci of restricted diffusion to suggest acute infarct. Gray-white matter differentiation maintained. Major intracranial vascular flow voids are preserved. No acute intracranial hemorrhage. Small focus susceptibility air that within a left superior cerebellar vermis likely reflects a small chronic micro hemorrhage. No mass lesion, midline shift, or mass effect. Ventricular prominence related to global parenchymal volume loss of hydrocephalus. No extra-axial fluid collection. Major dural sinuses are patent. Craniocervical junction within normal limits. Visualized upper cervical spine unremarkable. Incidental note made of a partially empty sella. No acute abnormality about the orbits. Sequela prior lens extraction on the right. Paranasal sinuses are clear. No mastoid effusion. Inner ear structures normal. Bone marrow signal intensity within normal limits. No scalp soft tissue abnormality. MRA HEAD FINDINGS ANTERIOR CIRCULATION: Visualized distal cervical segments of the internal carotid arteries are patent with antegrade flow. Petrous segments patent. Mild atheromatous irregularity within the cavernous and supraclinoid ICAs without flow-limiting stenosis. ICA termini widely patent. There is a a saccular aneurysm at the level of the right posterior communicating artery measuring approximately 8 x 6 mm (series 4, image 36). A1 segments patent. Anterior communicating artery normal. Anterior cerebral arteries are opacified to their distal aspect without definite flow limiting stenosis. Irregularity within the mid A2 segments favored to be related to motion. Right M1 segment patent without stenosis or occlusion. Right MCA bifurcation normal. Distal small vessel atheromatous irregularity within the  right MCA branches. The proximal and mid aspect of the left A1 segment are widely patent. Absent signal void within the distal left M1 segment, favored to be artifactual, as a normal flow void is seen within this region on on the T2 weighted sequence of the corresponding brain. Flow was seen distally within the left MCA branches, but demonstrate multifocal atheromatous irregularity. POSTERIOR CIRCULATION: Vertebral arteries patent to the vertebrobasilar junction. Left vertebral artery slightly dominant. Left posterior inferior cerebral artery patent. Right posterior inferior cerebral artery not well visualized. Basilar artery widely patent. Superior cerebellar arteries patent proximally. Left PCA arises from the basilar artery and is well opacified to its distal aspect. Fetal type right PCA supplied via the right posterior communicating artery. Right PCA is attenuated distally, and may be partially occluded. MRA NECK FINDINGS Visualized aortic arch is of normal caliber with normal branch pattern. No high-grade stenosis at the origin of the great vessels. Mild atheromatous narrowing of the proximal left subclavian artery without significant stenosis. Right common carotid artery patent from its origin to the bifurcation. No significant atheromatous narrowing about the right bifurcation. Right ICA widely patent from the bifurcation to the skullbase. No flow limiting stenosis, dissection, or vascular occlusion within the right carotid artery system. Left common carotid artery widely patent from its origin to the bifurcation. No significant stenosis about the left bifurcation. Left ICA widely patent from the bifurcation to the skullbase. No flow limiting stenosis, dissection, or vascular occlusion within the left carotid artery system. Vertebral arteries both arise from the subclavian arteries. Origin of the vertebral arteries not well seen on this exam. Vertebral arteries widely patent within the neck without  stenosis,  occlusion, or definite dissection. Left vertebral artery is dominant. IMPRESSION: MRI HEAD IMPRESSION: 1. No acute intracranial infarct or other process identified. 2. Age advanced cerebral atrophy with moderate chronic small vessel ischemic disease. 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 These findings are stable. MRA HEAD IMPRESSION: 1. No large or proximal arterial branch occlusion within the intracranial circulation. No high-grade or correctable stenosis. 2. Attenuation of the distal right PCA, which may be occluded. 3. 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 There is a fetal type right PCA. 4. Distal small vessel irregularity within the intracranial circulation. MRA NECK IMPRESSION: Negative MRA of the neck. No flow limiting or critical stenosis identified. Electronically Signed   By: Rise Mu M.D.   On: 06/13/2015 05:53   Mr Angiogram Neck W Wo Contrast  06/13/2015  CLINICAL DATA:  Initial evaluation for progressive right-sided weakness and aphasia with episodes of unresponsiveness. EXAM: MRI HEAD WITHOUT CONTRAST MRA HEAD WITHOUT CONTRAST MRA NECK WITHOUT AND WITH CONTRAST TECHNIQUE: Multiplanar, multiecho pulse sequences of the brain and surrounding structures were obtained without intravenous contrast. Angiographic images of the Circle of Willis were obtained using MRA technique without intravenous contrast. Angiographic images of the neck were obtained using MRA technique without and with intravenous contrast. Carotid stenosis measurements (when applicable) are obtained utilizing NASCET criteria, using the distal internal carotid diameter as the denominator. CONTRAST:  10mL MULTIHANCE GADOBENATE DIMEGLUMINE 529 MG/ML IV SOLN COMPARISON:  Previous CT from 06/12/2015 as well as previous brain MRI from 05/12/2015. FINDINGS: MRI HEAD FINDINGS Study somewhat degraded by motion artifact. Diffuse prominence of the CSF containing spaces compatible with generalized  cerebral atrophy, fairly advanced in nature. Patchy T2/FLAIR hyperintensity within the periventricular and deep white matter both cerebral hemispheres most likely related chronic small vessel ischemic disease, moderate nature. Chronic small vessel ischemic type changes present within the pons. Cortical/subcortical encephalomalacia within the parasagittal left frontal lobe consistent with remote infarct, relatively small in nature, and stable relative to previous MRI. No other remote infarcts identified. No abnormal foci of restricted diffusion to suggest acute infarct. Gray-white matter differentiation maintained. Major intracranial vascular flow voids are preserved. No acute intracranial hemorrhage. Small focus susceptibility air that within a left superior cerebellar vermis likely reflects a small chronic micro hemorrhage. No mass lesion, midline shift, or mass effect. Ventricular prominence related to global parenchymal volume loss of hydrocephalus. No extra-axial fluid collection. Major dural sinuses are patent. Craniocervical junction within normal limits. Visualized upper cervical spine unremarkable. Incidental note made of a partially empty sella. No acute abnormality about the orbits. Sequela prior lens extraction on the right. Paranasal sinuses are clear. No mastoid effusion. Inner ear structures normal. Bone marrow signal intensity within normal limits. No scalp soft tissue abnormality. MRA HEAD FINDINGS ANTERIOR CIRCULATION: Visualized distal cervical segments of the internal carotid arteries are patent with antegrade flow. Petrous segments patent. Mild atheromatous irregularity within the cavernous and supraclinoid ICAs without flow-limiting stenosis. ICA termini widely patent. There is a a saccular aneurysm at the level of the right posterior communicating artery measuring approximately 8 x 6 mm (series 4, image 36). A1 segments patent. Anterior communicating artery normal. Anterior cerebral arteries  are opacified to their distal aspect without definite flow limiting stenosis. Irregularity within the mid A2 segments favored to be related to motion. Right M1 segment patent without stenosis or occlusion. Right MCA bifurcation normal. Distal small vessel atheromatous irregularity within the right MCA branches. The proximal and mid aspect of the  left A1 segment are widely patent. Absent signal void within the distal left M1 segment, favored to be artifactual, as a normal flow void is seen within this region on on the T2 weighted sequence of the corresponding brain. Flow was seen distally within the left MCA branches, but demonstrate multifocal atheromatous irregularity. POSTERIOR CIRCULATION: Vertebral arteries patent to the vertebrobasilar junction. Left vertebral artery slightly dominant. Left posterior inferior cerebral artery patent. Right posterior inferior cerebral artery not well visualized. Basilar artery widely patent. Superior cerebellar arteries patent proximally. Left PCA arises from the basilar artery and is well opacified to its distal aspect. Fetal type right PCA supplied via the right posterior communicating artery. Right PCA is attenuated distally, and may be partially occluded. MRA NECK FINDINGS Visualized aortic arch is of normal caliber with normal branch pattern. No high-grade stenosis at the origin of the great vessels. Mild atheromatous narrowing of the proximal left subclavian artery without significant stenosis. Right common carotid artery patent from its origin to the bifurcation. No significant atheromatous narrowing about the right bifurcation. Right ICA widely patent from the bifurcation to the skullbase. No flow limiting stenosis, dissection, or vascular occlusion within the right carotid artery system. Left common carotid artery widely patent from its origin to the bifurcation. No significant stenosis about the left bifurcation. Left ICA widely patent from the bifurcation to the  skullbase. No flow limiting stenosis, dissection, or vascular occlusion within the left carotid artery system. Vertebral arteries both arise from the subclavian arteries. Origin of the vertebral arteries not well seen on this exam. Vertebral arteries widely patent within the neck without stenosis, occlusion, or definite dissection. Left vertebral artery is dominant. IMPRESSION: MRI HEAD IMPRESSION: 1. No acute intracranial infarct or other process identified. 2. Age advanced cerebral atrophy with moderate chronic small vessel ischemic disease. 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 These findings are stable. MRA HEAD IMPRESSION: 1. No large or proximal arterial branch occlusion within the intracranial circulation. No high-grade or correctable stenosis. 2. Attenuation of the distal right PCA, which may be occluded. 3. 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 There is a fetal type right PCA. 4. Distal small vessel irregularity within the intracranial circulation. MRA NECK IMPRESSION: Negative MRA of the neck. No flow limiting or critical stenosis identified. Electronically Signed   By: Rise Mu M.D.   On: 06/13/2015 05:53   Mr Brain Wo Contrast  06/13/2015  CLINICAL DATA:  Initial evaluation for progressive right-sided weakness and aphasia with episodes of unresponsiveness. EXAM: MRI HEAD WITHOUT CONTRAST MRA HEAD WITHOUT CONTRAST MRA NECK WITHOUT AND WITH CONTRAST TECHNIQUE: Multiplanar, multiecho pulse sequences of the brain and surrounding structures were obtained without intravenous contrast. Angiographic images of the Circle of Willis were obtained using MRA technique without intravenous contrast. Angiographic images of the neck were obtained using MRA technique without and with intravenous contrast. Carotid stenosis measurements (when applicable) are obtained utilizing NASCET criteria, using the distal internal carotid diameter as the denominator. CONTRAST:  10mL  MULTIHANCE GADOBENATE DIMEGLUMINE 529 MG/ML IV SOLN COMPARISON:  Previous CT from 06/12/2015 as well as previous brain MRI from 05/12/2015. FINDINGS: MRI HEAD FINDINGS Study somewhat degraded by motion artifact. Diffuse prominence of the CSF containing spaces compatible with generalized cerebral atrophy, fairly advanced in nature. Patchy T2/FLAIR hyperintensity within the periventricular and deep white matter both cerebral hemispheres most likely related chronic small vessel ischemic disease, moderate nature. Chronic small vessel ischemic type changes present within the pons. Cortical/subcortical encephalomalacia within the  parasagittal left frontal lobe consistent with remote infarct, relatively small in nature, and stable relative to previous MRI. No other remote infarcts identified. No abnormal foci of restricted diffusion to suggest acute infarct. Gray-white matter differentiation maintained. Major intracranial vascular flow voids are preserved. No acute intracranial hemorrhage. Small focus susceptibility air that within a left superior cerebellar vermis likely reflects a small chronic micro hemorrhage. No mass lesion, midline shift, or mass effect. Ventricular prominence related to global parenchymal volume loss of hydrocephalus. No extra-axial fluid collection. Major dural sinuses are patent. Craniocervical junction within normal limits. Visualized upper cervical spine unremarkable. Incidental note made of a partially empty sella. No acute abnormality about the orbits. Sequela prior lens extraction on the right. Paranasal sinuses are clear. No mastoid effusion. Inner ear structures normal. Bone marrow signal intensity within normal limits. No scalp soft tissue abnormality. MRA HEAD FINDINGS ANTERIOR CIRCULATION: Visualized distal cervical segments of the internal carotid arteries are patent with antegrade flow. Petrous segments patent. Mild atheromatous irregularity within the cavernous and supraclinoid ICAs  without flow-limiting stenosis. ICA termini widely patent. There is a a saccular aneurysm at the level of the right posterior communicating artery measuring approximately 8 x 6 mm (series 4, image 36). A1 segments patent. Anterior communicating artery normal. Anterior cerebral arteries are opacified to their distal aspect without definite flow limiting stenosis. Irregularity within the mid A2 segments favored to be related to motion. Right M1 segment patent without stenosis or occlusion. Right MCA bifurcation normal. Distal small vessel atheromatous irregularity within the right MCA branches. The proximal and mid aspect of the left A1 segment are widely patent. Absent signal void within the distal left M1 segment, favored to be artifactual, as a normal flow void is seen within this region on on the T2 weighted sequence of the corresponding brain. Flow was seen distally within the left MCA branches, but demonstrate multifocal atheromatous irregularity. POSTERIOR CIRCULATION: Vertebral arteries patent to the vertebrobasilar junction. Left vertebral artery slightly dominant. Left posterior inferior cerebral artery patent. Right posterior inferior cerebral artery not well visualized. Basilar artery widely patent. Superior cerebellar arteries patent proximally. Left PCA arises from the basilar artery and is well opacified to its distal aspect. Fetal type right PCA supplied via the right posterior communicating artery. Right PCA is attenuated distally, and may be partially occluded. MRA NECK FINDINGS Visualized aortic arch is of normal caliber with normal branch pattern. No high-grade stenosis at the origin of the great vessels. Mild atheromatous narrowing of the proximal left subclavian artery without significant stenosis. Right common carotid artery patent from its origin to the bifurcation. No significant atheromatous narrowing about the right bifurcation. Right ICA widely patent from the bifurcation to the skullbase.  No flow limiting stenosis, dissection, or vascular occlusion within the right carotid artery system. Left common carotid artery widely patent from its origin to the bifurcation. No significant stenosis about the left bifurcation. Left ICA widely patent from the bifurcation to the skullbase. No flow limiting stenosis, dissection, or vascular occlusion within the left carotid artery system. Vertebral arteries both arise from the subclavian arteries. Origin of the vertebral arteries not well seen on this exam. Vertebral arteries widely patent within the neck without stenosis, occlusion, or definite dissection. Left vertebral artery is dominant. IMPRESSION: MRI HEAD IMPRESSION: 1. No acute intracranial infarct or other process identified. 2. Age advanced cerebral atrophy with moderate chronic small vessel ischemic disease. 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 These findings are stable. MRA HEAD IMPRESSION: 1. No  large or proximal arterial branch occlusion within the intracranial circulation. No high-grade or correctable stenosis. 2. Attenuation of the distal right PCA, which may be occluded. 3. 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 There is a fetal type right PCA. 4. Distal small vessel irregularity within the intracranial circulation. MRA NECK IMPRESSION: Negative MRA of the neck. No flow limiting or critical stenosis identified. Electronically Signed   By: Rise Mu M.D.   On: 06/13/2015 05:53     CBC  Recent Labs Lab 06/12/15 1600 06/13/15 0518  WBC 6.3 4.7  HGB 12.6 11.9*  HCT 38.9 36.0  PLT 222 203  MCV 92.0 91.8  MCH 29.8 30.4  MCHC 32.4 33.1  RDW 13.7 13.9  LYMPHSABS 2.3  --   MONOABS 0.5  --   EOSABS 0.1  --   BASOSABS 0.0  --     Chemistries   Recent Labs Lab 06/12/15 1600 06/13/15 0518 06/14/15 0231 06/14/15 0356  NA 144 145 145 147*  K 3.8 4.0 4.3 3.5  CL 112* 115* 119* 119*  CO2 21* 17* 16* 18*  GLUCOSE 115* 78 66 67  BUN  21* 18 16 16   CREATININE 0.88 0.62 0.67 0.63  CALCIUM 9.3 8.6* 8.5* 8.5*  AST 18  --  18 13*  ALT 17  --  11* 11*  ALKPHOS 56  --  45 43  BILITOT 0.7  --  0.7 0.6   ------------------------------------------------------------------------------------------------------------------ CrCl cannot be calculated (Unknown ideal weight.). ------------------------------------------------------------------------------------------------------------------ No results for input(s): HGBA1C in the last 72 hours. ------------------------------------------------------------------------------------------------------------------ No results for input(s): CHOL, HDL, LDLCALC, TRIG, CHOLHDL, LDLDIRECT in the last 72 hours. ------------------------------------------------------------------------------------------------------------------  Recent Labs  06/12/15 2000  TSH 2.838   ------------------------------------------------------------------------------------------------------------------ No results for input(s): VITAMINB12, FOLATE, FERRITIN, TIBC, IRON, RETICCTPCT in the last 72 hours.  Coagulation profile  Recent Labs Lab 06/12/15 2000  INR 1.32    No results for input(s): DDIMER in the last 72 hours.  Cardiac Enzymes  Recent Labs Lab 06/12/15 1735 06/12/15 2340 06/13/15 0518  TROPONINI 0.29* 0.23* 0.14*   ------------------------------------------------------------------------------------------------------------------ Invalid input(s): POCBNP   CBG: No results for input(s): GLUCAP in the last 168 hours.     Studies: Dg Chest 2 View  06/12/2015  CLINICAL DATA:  Stroke.  Right-sided deficits and aphasia. EXAM: CHEST  2 VIEW COMPARISON:  05/11/2015 FINDINGS: The heart size and mediastinal contours are within normal limits. The pulmonary arteries appear prominent suggestive of PA hypertension. Both lungs are clear. The visualized skeletal structures are unremarkable. IMPRESSION: 1. No  acute findings. 2. Prominent pulmonary arteries suggestive of PA hypertension. Electronically Signed   By: Signa Kell M.D.   On: 06/12/2015 16:34   Ct Head Wo Contrast  06/12/2015  CLINICAL DATA:  Altered mental status EXAM: CT HEAD WITHOUT CONTRAST TECHNIQUE: Contiguous axial images were obtained from the base of the skull through the vertex without intravenous contrast. COMPARISON:  05/12/2015 FINDINGS: The bony calvarium is intact. Atrophic changes and chronic white matter ischemic changes are seen similar to that noted on prior MRI examination. No findings to suggest acute hemorrhage, acute infarction or space-occupying mass lesion are noted. IMPRESSION: No acute abnormality noted. Electronically Signed   By: Alcide Clever M.D.   On: 06/12/2015 16:56   Mr Maxine Glenn Head Wo Contrast  06/13/2015  CLINICAL DATA:  Initial evaluation for progressive right-sided weakness and aphasia with episodes of unresponsiveness. EXAM: MRI HEAD WITHOUT CONTRAST MRA HEAD WITHOUT CONTRAST MRA NECK WITHOUT AND WITH CONTRAST  TECHNIQUE: Multiplanar, multiecho pulse sequences of the brain and surrounding structures were obtained without intravenous contrast. Angiographic images of the Circle of Willis were obtained using MRA technique without intravenous contrast. Angiographic images of the neck were obtained using MRA technique without and with intravenous contrast. Carotid stenosis measurements (when applicable) are obtained utilizing NASCET criteria, using the distal internal carotid diameter as the denominator. CONTRAST:  10mL MULTIHANCE GADOBENATE DIMEGLUMINE 529 MG/ML IV SOLN COMPARISON:  Previous CT from 06/12/2015 as well as previous brain MRI from 05/12/2015. FINDINGS: MRI HEAD FINDINGS Study somewhat degraded by motion artifact. Diffuse prominence of the CSF containing spaces compatible with generalized cerebral atrophy, fairly advanced in nature. Patchy T2/FLAIR hyperintensity within the periventricular and deep white  matter both cerebral hemispheres most likely related chronic small vessel ischemic disease, moderate nature. Chronic small vessel ischemic type changes present within the pons. Cortical/subcortical encephalomalacia within the parasagittal left frontal lobe consistent with remote infarct, relatively small in nature, and stable relative to previous MRI. No other remote infarcts identified. No abnormal foci of restricted diffusion to suggest acute infarct. Gray-white matter differentiation maintained. Major intracranial vascular flow voids are preserved. No acute intracranial hemorrhage. Small focus susceptibility air that within a left superior cerebellar vermis likely reflects a small chronic micro hemorrhage. No mass lesion, midline shift, or mass effect. Ventricular prominence related to global parenchymal volume loss of hydrocephalus. No extra-axial fluid collection. Major dural sinuses are patent. Craniocervical junction within normal limits. Visualized upper cervical spine unremarkable. Incidental note made of a partially empty sella. No acute abnormality about the orbits. Sequela prior lens extraction on the right. Paranasal sinuses are clear. No mastoid effusion. Inner ear structures normal. Bone marrow signal intensity within normal limits. No scalp soft tissue abnormality. MRA HEAD FINDINGS ANTERIOR CIRCULATION: Visualized distal cervical segments of the internal carotid arteries are patent with antegrade flow. Petrous segments patent. Mild atheromatous irregularity within the cavernous and supraclinoid ICAs without flow-limiting stenosis. ICA termini widely patent. There is a a saccular aneurysm at the level of the right posterior communicating artery measuring approximately 8 x 6 mm (series 4, image 36). A1 segments patent. Anterior communicating artery normal. Anterior cerebral arteries are opacified to their distal aspect without definite flow limiting stenosis. Irregularity within the mid A2 segments  favored to be related to motion. Right M1 segment patent without stenosis or occlusion. Right MCA bifurcation normal. Distal small vessel atheromatous irregularity within the right MCA branches. The proximal and mid aspect of the left A1 segment are widely patent. Absent signal void within the distal left M1 segment, favored to be artifactual, as a normal flow void is seen within this region on on the T2 weighted sequence of the corresponding brain. Flow was seen distally within the left MCA branches, but demonstrate multifocal atheromatous irregularity. POSTERIOR CIRCULATION: Vertebral arteries patent to the vertebrobasilar junction. Left vertebral artery slightly dominant. Left posterior inferior cerebral artery patent. Right posterior inferior cerebral artery not well visualized. Basilar artery widely patent. Superior cerebellar arteries patent proximally. Left PCA arises from the basilar artery and is well opacified to its distal aspect. Fetal type right PCA supplied via the right posterior communicating artery. Right PCA is attenuated distally, and may be partially occluded. MRA NECK FINDINGS Visualized aortic arch is of normal caliber with normal branch pattern. No high-grade stenosis at the origin of the great vessels. Mild atheromatous narrowing of the proximal left subclavian artery without significant stenosis. Right common carotid artery patent from its origin to the bifurcation. No  significant atheromatous narrowing about the right bifurcation. Right ICA widely patent from the bifurcation to the skullbase. No flow limiting stenosis, dissection, or vascular occlusion within the right carotid artery system. Left common carotid artery widely patent from its origin to the bifurcation. No significant stenosis about the left bifurcation. Left ICA widely patent from the bifurcation to the skullbase. No flow limiting stenosis, dissection, or vascular occlusion within the left carotid artery system. Vertebral  arteries both arise from the subclavian arteries. Origin of the vertebral arteries not well seen on this exam. Vertebral arteries widely patent within the neck without stenosis, occlusion, or definite dissection. Left vertebral artery is dominant. IMPRESSION: MRI HEAD IMPRESSION: 1. No acute intracranial infarct or other process identified. 2. Age advanced cerebral atrophy with moderate chronic small vessel ischemic disease. 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 These findings are stable. MRA HEAD IMPRESSION: 1. No large or proximal arterial branch occlusion within the intracranial circulation. No high-grade or correctable stenosis. 2. Attenuation of the distal right PCA, which may be occluded. 3. 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 There is a fetal type right PCA. 4. Distal small vessel irregularity within the intracranial circulation. MRA NECK IMPRESSION: Negative MRA of the neck. No flow limiting or critical stenosis identified. Electronically Signed   By: Rise Mu M.D.   On: 06/13/2015 05:53   Mr Angiogram Neck W Wo Contrast  06/13/2015  CLINICAL DATA:  Initial evaluation for progressive right-sided weakness and aphasia with episodes of unresponsiveness. EXAM: MRI HEAD WITHOUT CONTRAST MRA HEAD WITHOUT CONTRAST MRA NECK WITHOUT AND WITH CONTRAST TECHNIQUE: Multiplanar, multiecho pulse sequences of the brain and surrounding structures were obtained without intravenous contrast. Angiographic images of the Circle of Willis were obtained using MRA technique without intravenous contrast. Angiographic images of the neck were obtained using MRA technique without and with intravenous contrast. Carotid stenosis measurements (when applicable) are obtained utilizing NASCET criteria, using the distal internal carotid diameter as the denominator. CONTRAST:  10mL MULTIHANCE GADOBENATE DIMEGLUMINE 529 MG/ML IV SOLN COMPARISON:  Previous CT from 06/12/2015 as well as  previous brain MRI from 05/12/2015. FINDINGS: MRI HEAD FINDINGS Study somewhat degraded by motion artifact. Diffuse prominence of the CSF containing spaces compatible with generalized cerebral atrophy, fairly advanced in nature. Patchy T2/FLAIR hyperintensity within the periventricular and deep white matter both cerebral hemispheres most likely related chronic small vessel ischemic disease, moderate nature. Chronic small vessel ischemic type changes present within the pons. Cortical/subcortical encephalomalacia within the parasagittal left frontal lobe consistent with remote infarct, relatively small in nature, and stable relative to previous MRI. No other remote infarcts identified. No abnormal foci of restricted diffusion to suggest acute infarct. Gray-white matter differentiation maintained. Major intracranial vascular flow voids are preserved. No acute intracranial hemorrhage. Small focus susceptibility air that within a left superior cerebellar vermis likely reflects a small chronic micro hemorrhage. No mass lesion, midline shift, or mass effect. Ventricular prominence related to global parenchymal volume loss of hydrocephalus. No extra-axial fluid collection. Major dural sinuses are patent. Craniocervical junction within normal limits. Visualized upper cervical spine unremarkable. Incidental note made of a partially empty sella. No acute abnormality about the orbits. Sequela prior lens extraction on the right. Paranasal sinuses are clear. No mastoid effusion. Inner ear structures normal. Bone marrow signal intensity within normal limits. No scalp soft tissue abnormality. MRA HEAD FINDINGS ANTERIOR CIRCULATION: Visualized distal cervical segments of the internal carotid arteries are patent with antegrade flow. Petrous segments patent. Mild atheromatous irregularity  within the cavernous and supraclinoid ICAs without flow-limiting stenosis. ICA termini widely patent. There is a a saccular aneurysm at the level of  the right posterior communicating artery measuring approximately 8 x 6 mm (series 4, image 36). A1 segments patent. Anterior communicating artery normal. Anterior cerebral arteries are opacified to their distal aspect without definite flow limiting stenosis. Irregularity within the mid A2 segments favored to be related to motion. Right M1 segment patent without stenosis or occlusion. Right MCA bifurcation normal. Distal small vessel atheromatous irregularity within the right MCA branches. The proximal and mid aspect of the left A1 segment are widely patent. Absent signal void within the distal left M1 segment, favored to be artifactual, as a normal flow void is seen within this region on on the T2 weighted sequence of the corresponding brain. Flow was seen distally within the left MCA branches, but demonstrate multifocal atheromatous irregularity. POSTERIOR CIRCULATION: Vertebral arteries patent to the vertebrobasilar junction. Left vertebral artery slightly dominant. Left posterior inferior cerebral artery patent. Right posterior inferior cerebral artery not well visualized. Basilar artery widely patent. Superior cerebellar arteries patent proximally. Left PCA arises from the basilar artery and is well opacified to its distal aspect. Fetal type right PCA supplied via the right posterior communicating artery. Right PCA is attenuated distally, and may be partially occluded. MRA NECK FINDINGS Visualized aortic arch is of normal caliber with normal branch pattern. No high-grade stenosis at the origin of the great vessels. Mild atheromatous narrowing of the proximal left subclavian artery without significant stenosis. Right common carotid artery patent from its origin to the bifurcation. No significant atheromatous narrowing about the right bifurcation. Right ICA widely patent from the bifurcation to the skullbase. No flow limiting stenosis, dissection, or vascular occlusion within the right carotid artery system. Left  common carotid artery widely patent from its origin to the bifurcation. No significant stenosis about the left bifurcation. Left ICA widely patent from the bifurcation to the skullbase. No flow limiting stenosis, dissection, or vascular occlusion within the left carotid artery system. Vertebral arteries both arise from the subclavian arteries. Origin of the vertebral arteries not well seen on this exam. Vertebral arteries widely patent within the neck without stenosis, occlusion, or definite dissection. Left vertebral artery is dominant. IMPRESSION: MRI HEAD IMPRESSION: 1. No acute intracranial infarct or other process identified. 2. Age advanced cerebral atrophy with moderate chronic small vessel ischemic disease. 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 These findings are stable. MRA HEAD IMPRESSION: 1. No large or proximal arterial branch occlusion within the intracranial circulation. No high-grade or correctable stenosis. 2. Attenuation of the distal right PCA, which may be occluded. 3. 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 There is a fetal type right PCA. 4. Distal small vessel irregularity within the intracranial circulation. MRA NECK IMPRESSION: Negative MRA of the neck. No flow limiting or critical stenosis identified. Electronically Signed   By: Rise MuBenjamin  McClintock M.D.   On: 06/13/2015 05:53   Mr Brain Wo Contrast  06/13/2015  CLINICAL DATA:  Initial evaluation for progressive right-sided weakness and aphasia with episodes of unresponsiveness. EXAM: MRI HEAD WITHOUT CONTRAST MRA HEAD WITHOUT CONTRAST MRA NECK WITHOUT AND WITH CONTRAST TECHNIQUE: Multiplanar, multiecho pulse sequences of the brain and surrounding structures were obtained without intravenous contrast. Angiographic images of the Circle of Willis were obtained using MRA technique without intravenous contrast. Angiographic images of the neck were obtained using MRA technique without and with intravenous  contrast. Carotid stenosis measurements (when applicable)  are obtained utilizing NASCET criteria, using the distal internal carotid diameter as the denominator. CONTRAST:  10mL MULTIHANCE GADOBENATE DIMEGLUMINE 529 MG/ML IV SOLN COMPARISON:  Previous CT from 06/12/2015 as well as previous brain MRI from 05/12/2015. FINDINGS: MRI HEAD FINDINGS Study somewhat degraded by motion artifact. Diffuse prominence of the CSF containing spaces compatible with generalized cerebral atrophy, fairly advanced in nature. Patchy T2/FLAIR hyperintensity within the periventricular and deep white matter both cerebral hemispheres most likely related chronic small vessel ischemic disease, moderate nature. Chronic small vessel ischemic type changes present within the pons. Cortical/subcortical encephalomalacia within the parasagittal left frontal lobe consistent with remote infarct, relatively small in nature, and stable relative to previous MRI. No other remote infarcts identified. No abnormal foci of restricted diffusion to suggest acute infarct. Gray-white matter differentiation maintained. Major intracranial vascular flow voids are preserved. No acute intracranial hemorrhage. Small focus susceptibility air that within a left superior cerebellar vermis likely reflects a small chronic micro hemorrhage. No mass lesion, midline shift, or mass effect. Ventricular prominence related to global parenchymal volume loss of hydrocephalus. No extra-axial fluid collection. Major dural sinuses are patent. Craniocervical junction within normal limits. Visualized upper cervical spine unremarkable. Incidental note made of a partially empty sella. No acute abnormality about the orbits. Sequela prior lens extraction on the right. Paranasal sinuses are clear. No mastoid effusion. Inner ear structures normal. Bone marrow signal intensity within normal limits. No scalp soft tissue abnormality. MRA HEAD FINDINGS ANTERIOR CIRCULATION: Visualized distal  cervical segments of the internal carotid arteries are patent with antegrade flow. Petrous segments patent. Mild atheromatous irregularity within the cavernous and supraclinoid ICAs without flow-limiting stenosis. ICA termini widely patent. There is a a saccular aneurysm at the level of the right posterior communicating artery measuring approximately 8 x 6 mm (series 4, image 36). A1 segments patent. Anterior communicating artery normal. Anterior cerebral arteries are opacified to their distal aspect without definite flow limiting stenosis. Irregularity within the mid A2 segments favored to be related to motion. Right M1 segment patent without stenosis or occlusion. Right MCA bifurcation normal. Distal small vessel atheromatous irregularity within the right MCA branches. The proximal and mid aspect of the left A1 segment are widely patent. Absent signal void within the distal left M1 segment, favored to be artifactual, as a normal flow void is seen within this region on on the T2 weighted sequence of the corresponding brain. Flow was seen distally within the left MCA branches, but demonstrate multifocal atheromatous irregularity. POSTERIOR CIRCULATION: Vertebral arteries patent to the vertebrobasilar junction. Left vertebral artery slightly dominant. Left posterior inferior cerebral artery patent. Right posterior inferior cerebral artery not well visualized. Basilar artery widely patent. Superior cerebellar arteries patent proximally. Left PCA arises from the basilar artery and is well opacified to its distal aspect. Fetal type right PCA supplied via the right posterior communicating artery. Right PCA is attenuated distally, and may be partially occluded. MRA NECK FINDINGS Visualized aortic arch is of normal caliber with normal branch pattern. No high-grade stenosis at the origin of the great vessels. Mild atheromatous narrowing of the proximal left subclavian artery without significant stenosis. Right common  carotid artery patent from its origin to the bifurcation. No significant atheromatous narrowing about the right bifurcation. Right ICA widely patent from the bifurcation to the skullbase. No flow limiting stenosis, dissection, or vascular occlusion within the right carotid artery system. Left common carotid artery widely patent from its origin to the bifurcation. No significant stenosis about the left bifurcation. Left  ICA widely patent from the bifurcation to the skullbase. No flow limiting stenosis, dissection, or vascular occlusion within the left carotid artery system. Vertebral arteries both arise from the subclavian arteries. Origin of the vertebral arteries not well seen on this exam. Vertebral arteries widely patent within the neck without stenosis, occlusion, or definite dissection. Left vertebral artery is dominant. IMPRESSION: MRI HEAD IMPRESSION: 1. No acute intracranial infarct or other process identified. 2. Age advanced cerebral atrophy with moderate chronic small vessel ischemic disease. 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 These findings are stable. MRA HEAD IMPRESSION: 1. No large or proximal arterial branch occlusion within the intracranial circulation. No high-grade or correctable stenosis. 2. Attenuation of the distal right PCA, which may be occluded. 3. 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 There is a fetal type right PCA. 4. Distal small vessel irregularity within the intracranial circulation. MRA NECK IMPRESSION: Negative MRA of the neck. No flow limiting or critical stenosis identified. Electronically Signed   By: Rise Mu M.D.   On: 06/13/2015 05:53      No results found for: HGBA1C Lab Results  Component Value Date   CREATININE 0.63 06/14/2015       Scheduled Meds: . aspirin  325 mg Oral Daily  . baclofen  5 mg Oral QHS  . cefTRIAXone (ROCEPHIN)  IV  1 g Intravenous Q24H  . heparin  5,000 Units Subcutaneous Q8H  .  levETIRAcetam  750 mg Intravenous Q12H  . pravastatin  10 mg Oral q1800  . QUEtiapine  25 mg Oral QHS  . sodium chloride flush  3 mL Intravenous Q12H   Continuous Infusions: . sodium chloride 75 mL/hr at 06/14/15 0804     LOS: 1 day    Time spent: >30 MINS    [facility]  [facility] Pager [phone]. If 7PM-7AM, please contact night-coverage at [email or web address], password [facility] 06/14/2015, 8:18 AM  LOS: 1 day

## 2015-06-14 NOTE — Procedures (Signed)
History: 82 year old female with a history of aphasia and concern for seizures  Sedation: None  Technique: This is a 21 channel routine scalp EEG performed at the bedside with bipolar and monopolar montages arranged in accordance to the international 10/20 system of electrode placement. One channel was dedicated to EKG recording.    Background: The background consists of intermixed alpha and beta activities. There is a well defined posterior dominant rhythm of 10 Hz that attenuates with eye opening. Sleep is not recorded.  Photic stimulation: Physiologic driving is not performed  EEG Abnormalities: None  Clinical Interpretation: This normal EEG is recorded in the waking state. There was no seizure or seizure predisposition recorded on this study. Please note that a normal EEG does not preclude the possibility of epilepsy.   Carrie SlotMcNeill Dolores Ewing, MD [facility] [phone]  If 7pm- 7am, please page neurology on call as listed in AMION.

## 2015-06-14 NOTE — Progress Notes (Signed)
Order for infusion was discontinued.  Patient failed swallow test and is unable to swallow anything. Paged Physician on call-Sofia. Ok to place order for infusion, she will [place]. Fluid running at 75.

## 2015-06-14 NOTE — Progress Notes (Signed)
EEG completed; results pending.    

## 2015-06-14 NOTE — Care Management Note (Signed)
Case Management Note Donn PieriniKristi Sandi Towe RN, BSN Unit 2W-Case Manager [phone]  Patient Details  Name: Carrie SaleDorita Riley MRN: [id number] Date of Birth: 1933-01-17  Subjective/Objective:     Pt admitted with acute encephalopathy (hx CVA)               Action/Plan: PTA pt lived at home with spouse- [facility]- RN/PT/OT/ST/CSW-  PC consult has been ordered for [facility] and is pending- per conversation with spouse at bedside- they have needed DME at home- lift was just delivered to home prior to admit and someone is to come out and show them how to use it- husband also states a hospital bed is on order but has yet to be delivered- CM [facility] to discharge [facility]- spouse plans on returning home with pt. CM to follow for d/c plans and recommendations after PC mtg.   Expected Discharge Date:                  Expected Discharge Plan:  Home w Home Health Services  In-House Referral:     Discharge planning Services  CM Consult  Post Acute Care Choice:  Home Health Choice offered to:  Spouse  DME Arranged:    DME Agency:  [facility]  HH Arranged:  RN, PT, OT, Speech Therapy, Social Work [employer] Agency:  [facility]  Status of Service:  In process, will continue to follow  Medicare Important Message Given:    Date Medicare IM Given:    Medicare IM give by:    Date Additional Medicare IM Given:    Additional Medicare Important Message give by:     If discussed at Long Length of Stay Meetings, dates discussed:    Additional Comments: 1st Contact- Glenda Campanaro- (granddaughter)- [phone] 2nd Contact- Vira BrownsKim Butler- (daughter)- [phone]   06/14/15- 1500- Donn PieriniKristi Maxxwell Edgett RN BSN- per PC - Yong ChannelAlicia Parker NP- pt's [facility]- they understand that insurance will not pay for transport due to this request coming from family and not a medical necessity - have spoken with pt's daughter Vira BrownsKim Butler  [phone] MD paged regarding this transfer request- [facility] (referral line to call [phone])- [employer] form has been placed on shadow chart for  MD signature. 1600- spoke with Dr. Susie CassetteAbrol- who will [facility] referral [facility] to see if there is an MD that will accept pt in tx- have explained to family that the level of care is no different here than the level of [facility] be [facility] and there may not be an accepting physician willing to take pt in tx. - Have updated pt's daughter Vira BrownsKim Butler and asked her for a primary contact person- she states to call her daughter Legrand PittsGlenda Lamia (pt's granddaughter) as primary contact [phone] and Selena BattenKim will be the secondary contact if Rivka BarbaraGlenda can not be reached with any updates regarding [facility]- CM to continue to follow  Darrold SpanWebster, Nole Robey Hall, RN 06/14/2015, 3:19 PM

## 2015-06-15 ENCOUNTER — Inpatient Hospital Stay (HOSPITAL_COMMUNITY): Payer: Medicare Other

## 2015-06-15 ENCOUNTER — Other Ambulatory Visit (HOSPITAL_COMMUNITY): Payer: Medicare Other

## 2015-06-15 LAB — BASIC METABOLIC PANEL
Anion gap: 10 (ref 5–15)
BUN: 10 mg/dL (ref 6–20)
CALCIUM: 8.6 mg/dL — AB (ref 8.9–10.3)
CO2: 17 mmol/L — AB (ref 22–32)
CREATININE: 0.56 mg/dL (ref 0.44–1.00)
Chloride: 115 mmol/L — ABNORMAL HIGH (ref 101–111)
GFR calc non Af Amer: >60 mL/min (ref >60)
GLUCOSE: 103 mg/dL — AB (ref 65–99)
Potassium: 3.2 mmol/L — ABNORMAL LOW (ref 3.5–5.1)
Sodium: 142 mmol/L (ref 135–145)

## 2015-06-15 LAB — CBC
HCT: 31.8 % — ABNORMAL LOW (ref 36.0–46.0)
Hemoglobin: 10.4 g/dL — ABNORMAL LOW (ref 12.0–15.0)
MCH: 29.5 pg (ref 26.0–34.0)
MCHC: 32.7 g/dL (ref 30.0–36.0)
MCV: 90.1 fL (ref 78.0–100.0)
PLATELETS: 222 10*3/uL (ref 150–400)
RBC: 3.53 MIL/uL — AB (ref 3.87–5.11)
RDW: 13.4 % (ref 11.5–15.5)
WBC: 6.7 10*3/uL (ref 4.0–10.5)

## 2015-06-15 LAB — MAGNESIUM: MAGNESIUM: 1.6 mg/dL — AB (ref 1.7–2.4)

## 2015-06-15 MED ORDER — GLUCAGON HCL RDNA (DIAGNOSTIC) 1 MG IJ SOLR
INTRAMUSCULAR | Status: AC
Start: 2015-06-15 — End: 2015-06-16
  Filled 2015-06-15: qty 1

## 2015-06-15 MED ORDER — METOPROLOL TARTRATE 5 MG/5ML IV SOLN
INTRAVENOUS | Status: AC
  Filled 2015-06-15: qty 5

## 2015-06-15 MED ORDER — HEPARIN SODIUM (PORCINE) 5000 UNIT/ML IJ SOLN
5000.0000 [IU] | Freq: Three times a day (TID) | INTRAMUSCULAR | Status: DC
  Administered 2015-06-16 – 2015-06-18 (×8): 5000 [IU] via SUBCUTANEOUS
  Filled 2015-06-15 (×8): qty 1

## 2015-06-15 MED ORDER — FENTANYL CITRATE (PF) 100 MCG/2ML IJ SOLN
INTRAMUSCULAR | Status: AC | PRN
  Administered 2015-06-15: 25 ug via INTRAVENOUS

## 2015-06-15 MED ORDER — IOPAMIDOL (ISOVUE-300) INJECTION 61%
INTRAVENOUS | Status: AC
  Administered 2015-06-15: 20 mL
  Filled 2015-06-15: qty 50

## 2015-06-15 MED ORDER — JEVITY 1.2 CAL PO LIQD
1000.0000 mL | ORAL | Status: DC
  Administered 2015-06-15: 15 mL/h
  Administered 2015-06-16 – 2015-06-18 (×3): 1000 mL
  Filled 2015-06-15 (×4): qty 1000

## 2015-06-15 MED ORDER — FENTANYL CITRATE (PF) 100 MCG/2ML IJ SOLN
INTRAMUSCULAR | Status: AC
  Filled 2015-06-15: qty 2

## 2015-06-15 MED ORDER — HYDRALAZINE HCL 20 MG/ML IJ SOLN
INTRAMUSCULAR | Status: AC | PRN
  Administered 2015-06-15: 10 mg via INTRAVENOUS

## 2015-06-15 MED ORDER — MIDAZOLAM HCL 2 MG/2ML IJ SOLN
INTRAMUSCULAR | Status: AC
  Filled 2015-06-15: qty 2

## 2015-06-15 MED ORDER — MIDAZOLAM HCL 2 MG/2ML IJ SOLN
INTRAMUSCULAR | Status: AC | PRN
  Administered 2015-06-15: 1 mg via INTRAVENOUS

## 2015-06-15 MED ORDER — LIDOCAINE HCL 1 % IJ SOLN
INTRAMUSCULAR | Status: AC
  Filled 2015-06-15: qty 20

## 2015-06-15 MED ORDER — HYDRALAZINE HCL 20 MG/ML IJ SOLN
INTRAMUSCULAR | Status: AC
  Filled 2015-06-15: qty 1

## 2015-06-15 MED ORDER — JEVITY 1.2 CAL PO LIQD: 1000.0000 mL | ORAL | Status: DC

## 2015-06-15 MED ORDER — SODIUM CHLORIDE 0.9% FLUSH
10.0000 mL | INTRAVENOUS | Status: DC | PRN
  Administered 2015-06-15 – 2015-06-18 (×2): 10 mL
  Filled 2015-06-15: qty 40

## 2015-06-15 NOTE — Consult Note (Signed)
Chief Complaint: Patient was seen in consultation today for percutaneous gastric tube placement Chief Complaint  Patient presents with  . Altered Mental Status   at the request of Dr Richarda Overlie  Referring Physician(s): Dr Richarda Overlie  Supervising Physician: Jolaine Click  Patient Status: In-pt   History of Present Illness: Carrie Riley is a 82 y.o. female   CVA Encephalopathy No response Aphasic Deconditioning Dysphagia Seizures No source for feeding or meds Need for long term care Request made for percutaneous gastric tube placement Dr Bonnielee Haff has reviewed imaging and approves procedure  Past Medical History  Diagnosis Date  . Cataract   . Essential hypertension   . Depression   . Hyperlipidemia   . Stroke [facility])     Past Surgical History  Procedure Laterality Date  . Tubal ligation    . Hernia repair      hiatal, years ago  . Cataract extraction Right 10/2013  . Tonsillectomy    . Iud removed      due to infection    Allergies: Ciprofloxacin; Other; and Citric acid  Medications: Prior to Admission medications   Medication Sig Start Date End Date Taking? Authorizing Provider  baclofen (LIORESAL) 10 MG tablet Take 5 mg by mouth at bedtime. For 30 days ending 06-05-15 05/05/15  Yes Historical Provider, MD  [facility] ASPIRIN 325 MG tablet Take 325 mg by mouth daily. 04/05/15  Yes Historical Provider, MD  docusate sodium (COLACE) 100 MG capsule Take 100 mg by mouth 2 (two) times daily. 05/04/15  Yes Historical Provider, MD  gabapentin (NEURONTIN) 100 MG capsule Take 100 mg by mouth 3 (three) times daily. 05/04/15  Yes Historical Provider, MD  levETIRAcetam (KEPPRA) 500 MG tablet Take 1 tablet (500 mg total) by mouth 2 (two) times daily. 05/13/15  Yes Rhetta Mura, MD  lisinopril (PRINIVIL,ZESTRIL) 5 MG tablet Take 5 mg by mouth daily. 04/25/15  Yes Historical Provider, MD  megestrol (MEGACE) 40 MG tablet Take 40 mg by mouth 2 (two) times daily. 05/04/15  Yes  Historical Provider, MD  Multiple Vitamin (MULTIVITAMIN) tablet Take 1 tablet by mouth daily.   Yes Historical Provider, MD  pravastatin (PRAVACHOL) 10 MG tablet Take 10 mg by mouth daily at 6 PM. 04/25/15  Yes Historical Provider, MD  traZODone (DESYREL) 50 MG tablet Take 50 mg by mouth at bedtime. 05/08/15  Yes Historical Provider, MD  azithromycin (ZITHROMAX) 250 MG tablet Take 2 tabs PO x 1 dose, then 1 tab PO QD x 4 days Patient not taking: Reported on 05/13/2015 12/11/14   Elvina Sidle, MD  celecoxib (CELEBREX) 100 MG capsule Take 1 capsule (100 mg total) by mouth 2 (two) times daily. Patient not taking: Reported on 05/13/2015 12/11/14   Elvina Sidle, MD  HYDROcodone-acetaminophen (NORCO/VICODIN) 5-325 MG tablet Take 2 tablets by mouth every 4 (four) hours as needed. Patient not taking: Reported on 05/13/2015 12/01/14   Samantha Tripp Dowless, PA-C  HYDROcodone-homatropine [facility]) 5-1.5 MG/5ML syrup Take 5 mLs by mouth every 8 (eight) hours as needed for cough. Patient not taking: Reported on 05/13/2015 12/11/14   Elvina Sidle, MD  sertraline (ZOLOFT) 25 MG tablet Take 1 tablet (25 mg total) by mouth daily. Patient not taking: Reported on 12/11/2014 07/30/13   [name], MD     Family History  Problem Relation Age of Onset  . Diabetes Mother   . Diabetes Father   . Alzheimer's disease Father     Social History   Social History  .  Marital Status: Married    Spouse Name: Carrie Riley  . Number of Children: 2  . Years of Education: 12   Occupational History  . retired     Chief Strategy Officer   Social History Main Topics  . Smoking status: Never Smoker   . Smokeless tobacco: Never Used  . Alcohol Use: No  . Drug Use: No  . Sexual Activity: Not Asked   Other Topics Concern  . None   Social History Narrative   Married to [place], lives at home   No caffeine      Review of Systems: A 12 point ROS discussed and pertinent positives are indicated in the HPI above.  All  other systems are negative.  Review of Systems  Constitutional:       No response   Psychiatric/Behavioral: Positive for agitation.    Vital Signs: BP 195/98 mmHg  Pulse 120  Temp(Src) 97.7 F (36.5 C) (Axillary)  Resp 20  Wt 110 lb 0.2 oz (49.9 kg)  SpO2 100%  Physical Exam  Cardiovascular: Normal heart sounds.   Pulmonary/Chest: Effort normal. She has no wheezes.  Abdominal: Soft.  Musculoskeletal:  No response No movement  Skin: Skin is warm and dry.  Psychiatric:  Consented with Husband at bedside  Nursing note and vitals reviewed.   Mallampati Score:  MD Evaluation Airway: WNL Heart: WNL Abdomen: WNL Chest/ Lungs: WNL ASA  Classification: 3 Mallampati/Airway Score: Two  Imaging: Dg Chest 2 View  06/12/2015  CLINICAL DATA:  Stroke.  Right-sided deficits and aphasia. EXAM: CHEST  2 VIEW COMPARISON:  05/11/2015 FINDINGS: The heart size and mediastinal contours are within normal limits. The pulmonary arteries appear prominent suggestive of PA hypertension. Both lungs are clear. The visualized skeletal structures are unremarkable. IMPRESSION: 1. No acute findings. 2. Prominent pulmonary arteries suggestive of PA hypertension. Electronically Signed   By: Signa Kell M.D.   On: 06/12/2015 16:34   Ct Head Wo Contrast  06/12/2015  CLINICAL DATA:  Altered mental status EXAM: CT HEAD WITHOUT CONTRAST TECHNIQUE: Contiguous axial images were obtained from the base of the skull through the vertex without intravenous contrast. COMPARISON:  05/12/2015 FINDINGS: The bony calvarium is intact. Atrophic changes and chronic white matter ischemic changes are seen similar to that noted on prior MRI examination. No findings to suggest acute hemorrhage, acute infarction or space-occupying mass lesion are noted. IMPRESSION: No acute abnormality noted. Electronically Signed   By: Alcide Clever M.D.   On: 06/12/2015 16:56   Mr Maxine Glenn Head Wo Contrast  06/13/2015  CLINICAL DATA:  Initial  evaluation for progressive right-sided weakness and aphasia with episodes of unresponsiveness. EXAM: MRI HEAD WITHOUT CONTRAST MRA HEAD WITHOUT CONTRAST MRA NECK WITHOUT AND WITH CONTRAST TECHNIQUE: Multiplanar, multiecho pulse sequences of the brain and surrounding structures were obtained without intravenous contrast. Angiographic images of the Circle of Willis were obtained using MRA technique without intravenous contrast. Angiographic images of the neck were obtained using MRA technique without and with intravenous contrast. Carotid stenosis measurements (when applicable) are obtained utilizing NASCET criteria, using the distal internal carotid diameter as the denominator. CONTRAST:  10mL MULTIHANCE GADOBENATE DIMEGLUMINE 529 MG/ML IV SOLN COMPARISON:  Previous CT from 06/12/2015 as well as previous brain MRI from 05/12/2015. FINDINGS: MRI HEAD FINDINGS Study somewhat degraded by motion artifact. Diffuse prominence of the CSF containing spaces compatible with generalized cerebral atrophy, fairly advanced in nature. Patchy T2/FLAIR hyperintensity within the periventricular and deep white matter both cerebral hemispheres most likely related chronic  small vessel ischemic disease, moderate nature. Chronic small vessel ischemic type changes present within the pons. Cortical/subcortical encephalomalacia within the parasagittal left frontal lobe consistent with remote infarct, relatively small in nature, and stable relative to previous MRI. No other remote infarcts identified. No abnormal foci of restricted diffusion to suggest acute infarct. Gray-white matter differentiation maintained. Major intracranial vascular flow voids are preserved. No acute intracranial hemorrhage. Small focus susceptibility air that within a left superior cerebellar vermis likely reflects a small chronic micro hemorrhage. No mass lesion, midline shift, or mass effect. Ventricular prominence related to global parenchymal volume loss of  hydrocephalus. No extra-axial fluid collection. Major dural sinuses are patent. Craniocervical junction within normal limits. Visualized upper cervical spine unremarkable. Incidental note made of a partially empty sella. No acute abnormality about the orbits. Sequela prior lens extraction on the right. Paranasal sinuses are clear. No mastoid effusion. Inner ear structures normal. Bone marrow signal intensity within normal limits. No scalp soft tissue abnormality. MRA HEAD FINDINGS ANTERIOR CIRCULATION: Visualized distal cervical segments of the internal carotid arteries are patent with antegrade flow. Petrous segments patent. Mild atheromatous irregularity within the cavernous and supraclinoid ICAs without flow-limiting stenosis. ICA termini widely patent. There is a a saccular aneurysm at the level of the right posterior communicating artery measuring approximately 8 x 6 mm (series 4, image 36). A1 segments patent. Anterior communicating artery normal. Anterior cerebral arteries are opacified to their distal aspect without definite flow limiting stenosis. Irregularity within the mid A2 segments favored to be related to motion. Right M1 segment patent without stenosis or occlusion. Right MCA bifurcation normal. Distal small vessel atheromatous irregularity within the right MCA branches. The proximal and mid aspect of the left A1 segment are widely patent. Absent signal void within the distal left M1 segment, favored to be artifactual, as a normal flow void is seen within this region on on the T2 weighted sequence of the corresponding brain. Flow was seen distally within the left MCA branches, but demonstrate multifocal atheromatous irregularity. POSTERIOR CIRCULATION: Vertebral arteries patent to the vertebrobasilar junction. Left vertebral artery slightly dominant. Left posterior inferior cerebral artery patent. Right posterior inferior cerebral artery not well visualized. Basilar artery widely patent. Superior  cerebellar arteries patent proximally. Left PCA arises from the basilar artery and is well opacified to its distal aspect. Fetal type right PCA supplied via the right posterior communicating artery. Right PCA is attenuated distally, and may be partially occluded. MRA NECK FINDINGS Visualized aortic arch is of normal caliber with normal branch pattern. No high-grade stenosis at the origin of the great vessels. Mild atheromatous narrowing of the proximal left subclavian artery without significant stenosis. Right common carotid artery patent from its origin to the bifurcation. No significant atheromatous narrowing about the right bifurcation. Right ICA widely patent from the bifurcation to the skullbase. No flow limiting stenosis, dissection, or vascular occlusion within the right carotid artery system. Left common carotid artery widely patent from its origin to the bifurcation. No significant stenosis about the left bifurcation. Left ICA widely patent from the bifurcation to the skullbase. No flow limiting stenosis, dissection, or vascular occlusion within the left carotid artery system. Vertebral arteries both arise from the subclavian arteries. Origin of the vertebral arteries not well seen on this exam. Vertebral arteries widely patent within the neck without stenosis, occlusion, or definite dissection. Left vertebral artery is dominant. IMPRESSION: MRI HEAD IMPRESSION: 1. No acute intracranial infarct or other process identified. 2. Age advanced cerebral atrophy with moderate chronic  small vessel ischemic disease. 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 These findings are stable. MRA HEAD IMPRESSION: 1. No large or proximal arterial branch occlusion within the intracranial circulation. No high-grade or correctable stenosis. 2. Attenuation of the distal right PCA, which may be occluded. 3. 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 There is a fetal type right PCA. 4. Distal small  vessel irregularity within the intracranial circulation. MRA NECK IMPRESSION: Negative MRA of the neck. No flow limiting or critical stenosis identified. Electronically Signed   By: Rise Mu M.D.   On: 06/13/2015 05:53   Mr Angiogram Neck W Wo Contrast  06/13/2015  CLINICAL DATA:  Initial evaluation for progressive right-sided weakness and aphasia with episodes of unresponsiveness. EXAM: MRI HEAD WITHOUT CONTRAST MRA HEAD WITHOUT CONTRAST MRA NECK WITHOUT AND WITH CONTRAST TECHNIQUE: Multiplanar, multiecho pulse sequences of the brain and surrounding structures were obtained without intravenous contrast. Angiographic images of the Circle of Willis were obtained using MRA technique without intravenous contrast. Angiographic images of the neck were obtained using MRA technique without and with intravenous contrast. Carotid stenosis measurements (when applicable) are obtained utilizing NASCET criteria, using the distal internal carotid diameter as the denominator. CONTRAST:  10mL MULTIHANCE GADOBENATE DIMEGLUMINE 529 MG/ML IV SOLN COMPARISON:  Previous CT from 06/12/2015 as well as previous brain MRI from 05/12/2015. FINDINGS: MRI HEAD FINDINGS Study somewhat degraded by motion artifact. Diffuse prominence of the CSF containing spaces compatible with generalized cerebral atrophy, fairly advanced in nature. Patchy T2/FLAIR hyperintensity within the periventricular and deep white matter both cerebral hemispheres most likely related chronic small vessel ischemic disease, moderate nature. Chronic small vessel ischemic type changes present within the pons. Cortical/subcortical encephalomalacia within the parasagittal left frontal lobe consistent with remote infarct, relatively small in nature, and stable relative to previous MRI. No other remote infarcts identified. No abnormal foci of restricted diffusion to suggest acute infarct. Gray-white matter differentiation maintained. Major intracranial vascular flow  voids are preserved. No acute intracranial hemorrhage. Small focus susceptibility air that within a left superior cerebellar vermis likely reflects a small chronic micro hemorrhage. No mass lesion, midline shift, or mass effect. Ventricular prominence related to global parenchymal volume loss of hydrocephalus. No extra-axial fluid collection. Major dural sinuses are patent. Craniocervical junction within normal limits. Visualized upper cervical spine unremarkable. Incidental note made of a partially empty sella. No acute abnormality about the orbits. Sequela prior lens extraction on the right. Paranasal sinuses are clear. No mastoid effusion. Inner ear structures normal. Bone marrow signal intensity within normal limits. No scalp soft tissue abnormality. MRA HEAD FINDINGS ANTERIOR CIRCULATION: Visualized distal cervical segments of the internal carotid arteries are patent with antegrade flow. Petrous segments patent. Mild atheromatous irregularity within the cavernous and supraclinoid ICAs without flow-limiting stenosis. ICA termini widely patent. There is a a saccular aneurysm at the level of the right posterior communicating artery measuring approximately 8 x 6 mm (series 4, image 36). A1 segments patent. Anterior communicating artery normal. Anterior cerebral arteries are opacified to their distal aspect without definite flow limiting stenosis. Irregularity within the mid A2 segments favored to be related to motion. Right M1 segment patent without stenosis or occlusion. Right MCA bifurcation normal. Distal small vessel atheromatous irregularity within the right MCA branches. The proximal and mid aspect of the left A1 segment are widely patent. Absent signal void within the distal left M1 segment, favored to be artifactual, as a normal flow void is seen within this region on on the  T2 weighted sequence of the corresponding brain. Flow was seen distally within the left MCA branches, but demonstrate multifocal  atheromatous irregularity. POSTERIOR CIRCULATION: Vertebral arteries patent to the vertebrobasilar junction. Left vertebral artery slightly dominant. Left posterior inferior cerebral artery patent. Right posterior inferior cerebral artery not well visualized. Basilar artery widely patent. Superior cerebellar arteries patent proximally. Left PCA arises from the basilar artery and is well opacified to its distal aspect. Fetal type right PCA supplied via the right posterior communicating artery. Right PCA is attenuated distally, and may be partially occluded. MRA NECK FINDINGS Visualized aortic arch is of normal caliber with normal branch pattern. No high-grade stenosis at the origin of the great vessels. Mild atheromatous narrowing of the proximal left subclavian artery without significant stenosis. Right common carotid artery patent from its origin to the bifurcation. No significant atheromatous narrowing about the right bifurcation. Right ICA widely patent from the bifurcation to the skullbase. No flow limiting stenosis, dissection, or vascular occlusion within the right carotid artery system. Left common carotid artery widely patent from its origin to the bifurcation. No significant stenosis about the left bifurcation. Left ICA widely patent from the bifurcation to the skullbase. No flow limiting stenosis, dissection, or vascular occlusion within the left carotid artery system. Vertebral arteries both arise from the subclavian arteries. Origin of the vertebral arteries not well seen on this exam. Vertebral arteries widely patent within the neck without stenosis, occlusion, or definite dissection. Left vertebral artery is dominant. IMPRESSION: MRI HEAD IMPRESSION: 1. No acute intracranial infarct or other process identified. 2. Age advanced cerebral atrophy with moderate chronic small vessel ischemic disease. 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 These findings are stable. MRA HEAD IMPRESSION: 1.  No large or proximal arterial branch occlusion within the intracranial circulation. No high-grade or correctable stenosis. 2. Attenuation of the distal right PCA, which may be occluded. 3. 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 There is a fetal type right PCA. 4. Distal small vessel irregularity within the intracranial circulation. MRA NECK IMPRESSION: Negative MRA of the neck. No flow limiting or critical stenosis identified. Electronically Signed   By: Rise MuBenjamin  McClintock M.D.   On: 06/13/2015 05:53   Mr Brain Wo Contrast  06/13/2015  CLINICAL DATA:  Initial evaluation for progressive right-sided weakness and aphasia with episodes of unresponsiveness. EXAM: MRI HEAD WITHOUT CONTRAST MRA HEAD WITHOUT CONTRAST MRA NECK WITHOUT AND WITH CONTRAST TECHNIQUE: Multiplanar, multiecho pulse sequences of the brain and surrounding structures were obtained without intravenous contrast. Angiographic images of the Circle of Willis were obtained using MRA technique without intravenous contrast. Angiographic images of the neck were obtained using MRA technique without and with intravenous contrast. Carotid stenosis measurements (when applicable) are obtained utilizing NASCET criteria, using the distal internal carotid diameter as the denominator. CONTRAST:  10mL MULTIHANCE GADOBENATE DIMEGLUMINE 529 MG/ML IV SOLN COMPARISON:  Previous CT from 06/12/2015 as well as previous brain MRI from 05/12/2015. FINDINGS: MRI HEAD FINDINGS Study somewhat degraded by motion artifact. Diffuse prominence of the CSF containing spaces compatible with generalized cerebral atrophy, fairly advanced in nature. Patchy T2/FLAIR hyperintensity within the periventricular and deep white matter both cerebral hemispheres most likely related chronic small vessel ischemic disease, moderate nature. Chronic small vessel ischemic type changes present within the pons. Cortical/subcortical encephalomalacia within the parasagittal left frontal  lobe consistent with remote infarct, relatively small in nature, and stable relative to previous MRI. No other remote infarcts identified. No abnormal foci of restricted diffusion to suggest  acute infarct. Gray-white matter differentiation maintained. Major intracranial vascular flow voids are preserved. No acute intracranial hemorrhage. Small focus susceptibility air that within a left superior cerebellar vermis likely reflects a small chronic micro hemorrhage. No mass lesion, midline shift, or mass effect. Ventricular prominence related to global parenchymal volume loss of hydrocephalus. No extra-axial fluid collection. Major dural sinuses are patent. Craniocervical junction within normal limits. Visualized upper cervical spine unremarkable. Incidental note made of a partially empty sella. No acute abnormality about the orbits. Sequela prior lens extraction on the right. Paranasal sinuses are clear. No mastoid effusion. Inner ear structures normal. Bone marrow signal intensity within normal limits. No scalp soft tissue abnormality. MRA HEAD FINDINGS ANTERIOR CIRCULATION: Visualized distal cervical segments of the internal carotid arteries are patent with antegrade flow. Petrous segments patent. Mild atheromatous irregularity within the cavernous and supraclinoid ICAs without flow-limiting stenosis. ICA termini widely patent. There is a a saccular aneurysm at the level of the right posterior communicating artery measuring approximately 8 x 6 mm (series 4, image 36). A1 segments patent. Anterior communicating artery normal. Anterior cerebral arteries are opacified to their distal aspect without definite flow limiting stenosis. Irregularity within the mid A2 segments favored to be related to motion. Right M1 segment patent without stenosis or occlusion. Right MCA bifurcation normal. Distal small vessel atheromatous irregularity within the right MCA branches. The proximal and mid aspect of the left A1 segment are  widely patent. Absent signal void within the distal left M1 segment, favored to be artifactual, as a normal flow void is seen within this region on on the T2 weighted sequence of the corresponding brain. Flow was seen distally within the left MCA branches, but demonstrate multifocal atheromatous irregularity. POSTERIOR CIRCULATION: Vertebral arteries patent to the vertebrobasilar junction. Left vertebral artery slightly dominant. Left posterior inferior cerebral artery patent. Right posterior inferior cerebral artery not well visualized. Basilar artery widely patent. Superior cerebellar arteries patent proximally. Left PCA arises from the basilar artery and is well opacified to its distal aspect. Fetal type right PCA supplied via the right posterior communicating artery. Right PCA is attenuated distally, and may be partially occluded. MRA NECK FINDINGS Visualized aortic arch is of normal caliber with normal branch pattern. No high-grade stenosis at the origin of the great vessels. Mild atheromatous narrowing of the proximal left subclavian artery without significant stenosis. Right common carotid artery patent from its origin to the bifurcation. No significant atheromatous narrowing about the right bifurcation. Right ICA widely patent from the bifurcation to the skullbase. No flow limiting stenosis, dissection, or vascular occlusion within the right carotid artery system. Left common carotid artery widely patent from its origin to the bifurcation. No significant stenosis about the left bifurcation. Left ICA widely patent from the bifurcation to the skullbase. No flow limiting stenosis, dissection, or vascular occlusion within the left carotid artery system. Vertebral arteries both arise from the subclavian arteries. Origin of the vertebral arteries not well seen on this exam. Vertebral arteries widely patent within the neck without stenosis, occlusion, or definite dissection. Left vertebral artery is dominant.  IMPRESSION: MRI HEAD IMPRESSION: 1. No acute intracranial infarct or other process identified. 2. Age advanced cerebral atrophy with moderate chronic small vessel ischemic disease. 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 These findings are stable. MRA HEAD IMPRESSION: 1. No large or proximal arterial branch occlusion within the intracranial circulation. No high-grade or correctable stenosis. 2. Attenuation of the distal right PCA, which may be occluded. 3. 8 x 6 mm  right posterior communicating artery aneurysm. There is a fetal type right PCA. 4. Distal small vessel irregularity within the intracranial circulation. MRA NECK IMPRESSION: Negative MRA of the neck. No flow limiting or critical stenosis identified. Electronically Signed   By: Rise Mu M.D.   On: 06/13/2015 05:53    Labs:  CBC:  Recent Labs  05/13/15 0603 06/12/15 1600 06/13/15 0518 06/15/15 0334  WBC 6.7 6.3 4.7 6.7  HGB 12.0 12.6 11.9* 10.4*  HCT 36.7 38.9 36.0 31.8*  PLT 231 222 203 222    COAGS:  Recent Labs  06/12/15 2000  INR 1.32    BMP:  Recent Labs  06/13/15 0518 06/14/15 0231 06/14/15 0356 06/15/15 0334  NA 145 145 147* 142  K 4.0 4.3 3.5 3.2*  CL 115* 119* 119* 115*  CO2 17* 16* 18* 17*  GLUCOSE 78 66 67 103*  BUN 18 16 16 10   CALCIUM 8.6* 8.5* 8.5* 8.6*  CREATININE 0.62 0.67 0.63 0.56  GFRNONAA >60 >60 >60 >60  GFRAA >60 >60 >60 >60    LIVER FUNCTION TESTS:  Recent Labs  05/13/15 0603 06/12/15 1600 06/14/15 0231 06/14/15 0356  BILITOT 0.6 0.7 0.7 0.6  AST 20 18 18  13*  ALT 15 17 11* 11*  ALKPHOS 47 56 45 43  PROT 5.9* 6.9 5.4* 5.4*  ALBUMIN 2.7* 3.0* 2.3* 2.2*    TUMOR MARKERS: No results for input(s): AFPTM, CEA, CA199, CHROMGRNA in the last 8760 hours.  Assessment and Plan:  Aphasia Dysphasia CVA Seizures Need for long term care Scheduled for perc G tube in IR Risks and Benefits discussed with the patient's husband including, but not  limited to the need for a barium enema during the procedure, bleeding, infection, peritonitis, or damage to adjacent structures. All of his questions were answered He wants to discuss with his family RN will let us know   Thank you for this interesting consult.  I greatly enjoyed meeting Carrie Riley and look forward to participating in their care.  A copy of this report was sent to the requesting provider on this date.  Electronically Signed: Ralene Muskrat A 06/15/2015, 9:29 AM   I spent a total of 40 Minutes    in face to face in clinical consultation, greater than 50% of which was counseling/coordinating care for percutaneous gastric tube placement

## 2015-06-15 NOTE — Progress Notes (Signed)
[facility] PROGRESS NOTE  Carrie Riley RUE:[id number] DOB: 12-08-1932 DOA: 06/12/2015   PCP: No PCP Per Patient     Assessment/Plan: Principal Problem:   Acute encephalopathy Active Problems:   Altered mental status   Stroke (HCC)   Hyperlipidemia   Depression   Essential hypertension   Aphasia   Encephalopathy   Palliative care encounter   Cerebrovascular accident (CVA) due to thrombosis of precerebral artery (HCC)    Brief summary 82 y.o. female, With history of large left frontal lobe ischemic CVA in the past, aphasic at baseline, dense right-sided hemiparesis with bedbound status at baseline,presents with transient episode of unresponsiveness. She was being seen by a home health aide who apparently observed that she had sudden episode of staring. He called the husband advised that they called 911. She apparently has had several of these episodes in the past, and was started on Keppra for them one month ago. Patient also has a history of essential hypertension, dyslipidemia, depression, UTI in the past, questionable seizures who lives at home, is bedbound, is aphasic at baseline,.  In the ER patient was found to be hypotensive, initial lab work was unremarkable, UA suspicious for UTI but specimen likely was not of good quality, she is afebrile, with the help of husband limited review of systems was obtained as below but was unremarkable. After receiving some IV fluid in the ER patient is already improving and getting close to her baseline.  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 Chest x-ray negative TSH normal, UA negative Physical therapy  evaluation-recommends hospital bed Continue to hold Neurontin, trazodone, continue low-dose baclofen to prevent withdrawal, however patient not taking by mouth medications Patient has previously refused all interventions when seen by her neurologist 09/13/14 ,[name], MD  Pending decision for PEG tube placement by interventional radiology, PICC line placement  Possible UTI-discontinue Rocephin pending urine culture results, unable to take oral medications, PICC line ordered, repeat UA  Hypokalemia-replete, check magnesium  Dyslipidemia-continue statin  History of CVA-, continue nothing by mouth, palliative care consult, Start seroquel /Haldol for agitation Unable to take PO  or IV medications at this point   PEG tube placement pending.  History of seizures-continue Keppra iv , PICC line ordered  Troponin elevation without non-ST elevation MI - EKG non acute, denies Chest pain, Trend Trop, ASA-Statin to continue. Tele. 2-D echo to rule out wall motion abnormalities  Tachycardia once PICC placed will start IV metoprolol  Pressure ulcer-wound care consultation ordered and staging is pending   DVT prophylaxsis heparin  Code Status:  Full code    Family Communication: Discussed in detail with the family, all imaging results, lab results explained to the patient   Disposition Plan:  Palliative care discussion pending, feeding tube issues needs to be addressed, continue ongoing discussions, patient lost her IV, discussed with husband would like to restrain her indefinitely to put a feeding tube. Patient has no medical indication to transfer to another facility. We have capability of placing PEG [consult requested] we have capability of placing a PICC line line.  Called [facility] AND SPOKE TO DR Gevena Mart , they have  to discuss with CMO and decide if the patient will be accepted,they have my direct cell ph no,patient also needs to be stabilised before transfer , HR up  in the 120's . Dr. Selena BattenKim declined transfer due to no medical necessity after chart review  Vira BrownsKim Butler at [phone] was updated with this plan of care      Consultants:  Neurology  Palliative care    Procedures:  None  Antibiotics: Rocephin      HPI/Subjective: Patient still moaning and groaning in pain, has no IV access, PICC line team unable to place PICC line  Objective: Filed Vitals:   06/14/15 0800 06/14/15 1350 06/14/15 2010 06/15/15 0423  BP: 147/91 153/91 157/84 195/98  Pulse: 117 108 109 120  Temp: 98.6 F (37 C) 98.4 F (36.9 C) 98.4 F (36.9 C) 97.7 F (36.5 C)  TempSrc: Axillary Axillary Axillary Axillary  Resp: 20 20 18 20   Weight:    49.9 kg (110 lb 0.2 oz)  SpO2: 100% 100% 100% 100%    Intake/Output Summary (Last 24 hours) at 06/15/15 [id number] Last data filed at 06/14/15 1300  Gross per 24 hour  Intake      0 ml  Output      0 ml  Net      0 ml    Exam:  Examination:  General exam: Very uncomfortable, moaning and groaning in pain Respiratory system: Clear to auscultation. Respiratory effort normal. Cardiovascular system: S1 & S2 heard, RRR. No JVD, murmurs, rubs, gallops or clicks. No pedal edema. Gastrointestinal system: Abdomen is nondistended, soft and nontender. No organomegaly or masses felt. Normal bowel sounds heard. Central nervous system: Alert and oriented. No focal neurological deficits. Extremities: Symmetric 5 x 5 power. Skin: No rashes, lesions or ulcers Psychiatry: Judgement and insight appear normal. Mood & affect appropriate.     Data Reviewed: I have personally reviewed following labs and imaging studies  Micro Results No results found for this or any previous visit (from the past 240 hour(s)).  Radiology Reports Dg Chest 2 View  06/12/2015  CLINICAL DATA:  Stroke.  Right-sided deficits and aphasia. EXAM: CHEST  2 VIEW COMPARISON:  05/11/2015 FINDINGS: The heart size and mediastinal contours are within normal limits.  The pulmonary arteries appear prominent suggestive of PA hypertension. Both lungs are clear. The visualized skeletal structures are unremarkable. IMPRESSION: 1. No acute findings. 2. Prominent pulmonary arteries suggestive of PA hypertension. Electronically Signed   By: Signa Kellaylor  Stroud M.D.   On: 06/12/2015 16:34   Ct Head Wo Contrast  06/12/2015  CLINICAL DATA:  Altered mental status EXAM: CT HEAD WITHOUT CONTRAST TECHNIQUE: Contiguous axial images were obtained from the base of the skull through the vertex without intravenous contrast. COMPARISON:  05/12/2015 FINDINGS: The bony calvarium is intact. Atrophic changes and chronic white matter ischemic changes are seen similar to that noted on prior MRI examination. No findings to suggest acute hemorrhage, acute infarction or space-occupying mass lesion are noted. IMPRESSION: No acute abnormality noted. Electronically Signed   By: Alcide CleverMark  Lukens M.D.   On: 06/12/2015 16:56   Mr Maxine GlennMra Head Wo Contrast  06/13/2015  CLINICAL DATA:  Initial evaluation for progressive right-sided weakness and aphasia with episodes of unresponsiveness. EXAM: MRI HEAD WITHOUT CONTRAST MRA HEAD WITHOUT CONTRAST MRA NECK WITHOUT AND WITH CONTRAST TECHNIQUE: Multiplanar, multiecho pulse sequences of the brain and surrounding structures were obtained without intravenous contrast. Angiographic images of the Circle of Willis were obtained using MRA technique without intravenous contrast.  Angiographic images of the neck were obtained using MRA technique without and with intravenous contrast. Carotid stenosis measurements (when applicable) are obtained utilizing NASCET criteria, using the distal internal carotid diameter as the denominator. CONTRAST:  10mL MULTIHANCE GADOBENATE DIMEGLUMINE 529 MG/ML IV SOLN COMPARISON:  Previous CT from 06/12/2015 as well as previous brain MRI from 05/12/2015. FINDINGS: MRI HEAD FINDINGS Study somewhat degraded by motion artifact. Diffuse prominence of the CSF  containing spaces compatible with generalized cerebral atrophy, fairly advanced in nature. Patchy T2/FLAIR hyperintensity within the periventricular and deep white matter both cerebral hemispheres most likely related chronic small vessel ischemic disease, moderate nature. Chronic small vessel ischemic type changes present within the pons. Cortical/subcortical encephalomalacia within the parasagittal left frontal lobe consistent with remote infarct, relatively small in nature, and stable relative to previous MRI. No other remote infarcts identified. No abnormal foci of restricted diffusion to suggest acute infarct. Gray-white matter differentiation maintained. Major intracranial vascular flow voids are preserved. No acute intracranial hemorrhage. Small focus susceptibility air that within a left superior cerebellar vermis likely reflects a small chronic micro hemorrhage. No mass lesion, midline shift, or mass effect. Ventricular prominence related to global parenchymal volume loss of hydrocephalus. No extra-axial fluid collection. Major dural sinuses are patent. Craniocervical junction within normal limits. Visualized upper cervical spine unremarkable. Incidental note made of a partially empty sella. No acute abnormality about the orbits. Sequela prior lens extraction on the right. Paranasal sinuses are clear. No mastoid effusion. Inner ear structures normal. Bone marrow signal intensity within normal limits. No scalp soft tissue abnormality. MRA HEAD FINDINGS ANTERIOR CIRCULATION: Visualized distal cervical segments of the internal carotid arteries are patent with antegrade flow. Petrous segments patent. Mild atheromatous irregularity within the cavernous and supraclinoid ICAs without flow-limiting stenosis. ICA termini widely patent. There is a a saccular aneurysm at the level of the right posterior communicating artery measuring approximately 8 x 6 mm (series 4, image 36). A1 segments patent. Anterior  communicating artery normal. Anterior cerebral arteries are opacified to their distal aspect without definite flow limiting stenosis. Irregularity within the mid A2 segments favored to be related to motion. Right M1 segment patent without stenosis or occlusion. Right MCA bifurcation normal. Distal small vessel atheromatous irregularity within the right MCA branches. The proximal and mid aspect of the left A1 segment are widely patent. Absent signal void within the distal left M1 segment, favored to be artifactual, as a normal flow void is seen within this region on on the T2 weighted sequence of the corresponding brain. Flow was seen distally within the left MCA branches, but demonstrate multifocal atheromatous irregularity. POSTERIOR CIRCULATION: Vertebral arteries patent to the vertebrobasilar junction. Left vertebral artery slightly dominant. Left posterior inferior cerebral artery patent. Right posterior inferior cerebral artery not well visualized. Basilar artery widely patent. Superior cerebellar arteries patent proximally. Left PCA arises from the basilar artery and is well opacified to its distal aspect. Fetal type right PCA supplied via the right posterior communicating artery. Right PCA is attenuated distally, and may be partially occluded. MRA NECK FINDINGS Visualized aortic arch is of normal caliber with normal branch pattern. No high-grade stenosis at the origin of the great vessels. Mild atheromatous narrowing of the proximal left subclavian artery without significant stenosis. Right common carotid artery patent from its origin to the bifurcation. No significant atheromatous narrowing about the right bifurcation. Right ICA widely patent from the bifurcation to the skullbase. No flow limiting stenosis, dissection, or vascular occlusion within the right carotid artery system.  Left common carotid artery widely patent from its origin to the bifurcation. No significant stenosis about the left bifurcation.  Left ICA widely patent from the bifurcation to the skullbase. No flow limiting stenosis, dissection, or vascular occlusion within the left carotid artery system. Vertebral arteries both arise from the subclavian arteries. Origin of the vertebral arteries not well seen on this exam. Vertebral arteries widely patent within the neck without stenosis, occlusion, or definite dissection. Left vertebral artery is dominant. IMPRESSION: MRI HEAD IMPRESSION: 1. No acute intracranial infarct or other process identified. 2. Age advanced cerebral atrophy with moderate chronic small vessel ischemic disease. 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 These findings are stable. MRA HEAD IMPRESSION: 1. No large or proximal arterial branch occlusion within the intracranial circulation. No high-grade or correctable stenosis. 2. Attenuation of the distal right PCA, which may be occluded. 3. 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 There is a fetal type right PCA. 4. Distal small vessel irregularity within the intracranial circulation. MRA NECK IMPRESSION: Negative MRA of the neck. No flow limiting or critical stenosis identified. Electronically Signed   By: Rise Mu M.D.   On: 06/13/2015 05:53   Mr Angiogram Neck W Wo Contrast  06/13/2015  CLINICAL DATA:  Initial evaluation for progressive right-sided weakness and aphasia with episodes of unresponsiveness. EXAM: MRI HEAD WITHOUT CONTRAST MRA HEAD WITHOUT CONTRAST MRA NECK WITHOUT AND WITH CONTRAST TECHNIQUE: Multiplanar, multiecho pulse sequences of the brain and surrounding structures were obtained without intravenous contrast. Angiographic images of the Circle of Willis were obtained using MRA technique without intravenous contrast. Angiographic images of the neck were obtained using MRA technique without and with intravenous contrast. Carotid stenosis measurements (when applicable) are obtained utilizing NASCET criteria, using the distal  internal carotid diameter as the denominator. CONTRAST:  10mL MULTIHANCE GADOBENATE DIMEGLUMINE 529 MG/ML IV SOLN COMPARISON:  Previous CT from 06/12/2015 as well as previous brain MRI from 05/12/2015. FINDINGS: MRI HEAD FINDINGS Study somewhat degraded by motion artifact. Diffuse prominence of the CSF containing spaces compatible with generalized cerebral atrophy, fairly advanced in nature. Patchy T2/FLAIR hyperintensity within the periventricular and deep white matter both cerebral hemispheres most likely related chronic small vessel ischemic disease, moderate nature. Chronic small vessel ischemic type changes present within the pons. Cortical/subcortical encephalomalacia within the parasagittal left frontal lobe consistent with remote infarct, relatively small in nature, and stable relative to previous MRI. No other remote infarcts identified. No abnormal foci of restricted diffusion to suggest acute infarct. Gray-white matter differentiation maintained. Major intracranial vascular flow voids are preserved. No acute intracranial hemorrhage. Small focus susceptibility air that within a left superior cerebellar vermis likely reflects a small chronic micro hemorrhage. No mass lesion, midline shift, or mass effect. Ventricular prominence related to global parenchymal volume loss of hydrocephalus. No extra-axial fluid collection. Major dural sinuses are patent. Craniocervical junction within normal limits. Visualized upper cervical spine unremarkable. Incidental note made of a partially empty sella. No acute abnormality about the orbits. Sequela prior lens extraction on the right. Paranasal sinuses are clear. No mastoid effusion. Inner ear structures normal. Bone marrow signal intensity within normal limits. No scalp soft tissue abnormality. MRA HEAD FINDINGS ANTERIOR CIRCULATION: Visualized distal cervical segments of the internal carotid arteries are patent with antegrade flow. Petrous segments patent. Mild  atheromatous irregularity within the cavernous and supraclinoid ICAs without flow-limiting stenosis. ICA termini widely patent. There is a a saccular aneurysm at the level of the right posterior communicating artery measuring approximately  8 x 6 mm (series 4, image 36). A1 segments patent. Anterior communicating artery normal. Anterior cerebral arteries are opacified to their distal aspect without definite flow limiting stenosis. Irregularity within the mid A2 segments favored to be related to motion. Right M1 segment patent without stenosis or occlusion. Right MCA bifurcation normal. Distal small vessel atheromatous irregularity within the right MCA branches. The proximal and mid aspect of the left A1 segment are widely patent. Absent signal void within the distal left M1 segment, favored to be artifactual, as a normal flow void is seen within this region on on the T2 weighted sequence of the corresponding brain. Flow was seen distally within the left MCA branches, but demonstrate multifocal atheromatous irregularity. POSTERIOR CIRCULATION: Vertebral arteries patent to the vertebrobasilar junction. Left vertebral artery slightly dominant. Left posterior inferior cerebral artery patent. Right posterior inferior cerebral artery not well visualized. Basilar artery widely patent. Superior cerebellar arteries patent proximally. Left PCA arises from the basilar artery and is well opacified to its distal aspect. Fetal type right PCA supplied via the right posterior communicating artery. Right PCA is attenuated distally, and may be partially occluded. MRA NECK FINDINGS Visualized aortic arch is of normal caliber with normal branch pattern. No high-grade stenosis at the origin of the great vessels. Mild atheromatous narrowing of the proximal left subclavian artery without significant stenosis. Right common carotid artery patent from its origin to the bifurcation. No significant atheromatous narrowing about the right  bifurcation. Right ICA widely patent from the bifurcation to the skullbase. No flow limiting stenosis, dissection, or vascular occlusion within the right carotid artery system. Left common carotid artery widely patent from its origin to the bifurcation. No significant stenosis about the left bifurcation. Left ICA widely patent from the bifurcation to the skullbase. No flow limiting stenosis, dissection, or vascular occlusion within the left carotid artery system. Vertebral arteries both arise from the subclavian arteries. Origin of the vertebral arteries not well seen on this exam. Vertebral arteries widely patent within the neck without stenosis, occlusion, or definite dissection. Left vertebral artery is dominant. IMPRESSION: MRI HEAD IMPRESSION: 1. No acute intracranial infarct or other process identified. 2. Age advanced cerebral atrophy with moderate chronic small vessel ischemic disease. 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 These findings are stable. MRA HEAD IMPRESSION: 1. No large or proximal arterial branch occlusion within the intracranial circulation. No high-grade or correctable stenosis. 2. Attenuation of the distal right PCA, which may be occluded. 3. 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 There is a fetal type right PCA. 4. Distal small vessel irregularity within the intracranial circulation. MRA NECK IMPRESSION: Negative MRA of the neck. No flow limiting or critical stenosis identified. Electronically Signed   By: Rise Mu M.D.   On: 06/13/2015 05:53   Mr Brain Wo Contrast  06/13/2015  CLINICAL DATA:  Initial evaluation for progressive right-sided weakness and aphasia with episodes of unresponsiveness. EXAM: MRI HEAD WITHOUT CONTRAST MRA HEAD WITHOUT CONTRAST MRA NECK WITHOUT AND WITH CONTRAST TECHNIQUE: Multiplanar, multiecho pulse sequences of the brain and surrounding structures were obtained without intravenous contrast. Angiographic images of  the Circle of Willis were obtained using MRA technique without intravenous contrast. Angiographic images of the neck were obtained using MRA technique without and with intravenous contrast. Carotid stenosis measurements (when applicable) are obtained utilizing NASCET criteria, using the distal internal carotid diameter as the denominator. CONTRAST:  10mL MULTIHANCE GADOBENATE DIMEGLUMINE 529 MG/ML IV SOLN COMPARISON:  Previous CT from 06/12/2015 as  well as previous brain MRI from 05/12/2015. FINDINGS: MRI HEAD FINDINGS Study somewhat degraded by motion artifact. Diffuse prominence of the CSF containing spaces compatible with generalized cerebral atrophy, fairly advanced in nature. Patchy T2/FLAIR hyperintensity within the periventricular and deep white matter both cerebral hemispheres most likely related chronic small vessel ischemic disease, moderate nature. Chronic small vessel ischemic type changes present within the pons. Cortical/subcortical encephalomalacia within the parasagittal left frontal lobe consistent with remote infarct, relatively small in nature, and stable relative to previous MRI. No other remote infarcts identified. No abnormal foci of restricted diffusion to suggest acute infarct. Gray-white matter differentiation maintained. Major intracranial vascular flow voids are preserved. No acute intracranial hemorrhage. Small focus susceptibility air that within a left superior cerebellar vermis likely reflects a small chronic micro hemorrhage. No mass lesion, midline shift, or mass effect. Ventricular prominence related to global parenchymal volume loss of hydrocephalus. No extra-axial fluid collection. Major dural sinuses are patent. Craniocervical junction within normal limits. Visualized upper cervical spine unremarkable. Incidental note made of a partially empty sella. No acute abnormality about the orbits. Sequela prior lens extraction on the right. Paranasal sinuses are clear. No mastoid  effusion. Inner ear structures normal. Bone marrow signal intensity within normal limits. No scalp soft tissue abnormality. MRA HEAD FINDINGS ANTERIOR CIRCULATION: Visualized distal cervical segments of the internal carotid arteries are patent with antegrade flow. Petrous segments patent. Mild atheromatous irregularity within the cavernous and supraclinoid ICAs without flow-limiting stenosis. ICA termini widely patent. There is a a saccular aneurysm at the level of the right posterior communicating artery measuring approximately 8 x 6 mm (series 4, image 36). A1 segments patent. Anterior communicating artery normal. Anterior cerebral arteries are opacified to their distal aspect without definite flow limiting stenosis. Irregularity within the mid A2 segments favored to be related to motion. Right M1 segment patent without stenosis or occlusion. Right MCA bifurcation normal. Distal small vessel atheromatous irregularity within the right MCA branches. The proximal and mid aspect of the left A1 segment are widely patent. Absent signal void within the distal left M1 segment, favored to be artifactual, as a normal flow void is seen within this region on on the T2 weighted sequence of the corresponding brain. Flow was seen distally within the left MCA branches, but demonstrate multifocal atheromatous irregularity. POSTERIOR CIRCULATION: Vertebral arteries patent to the vertebrobasilar junction. Left vertebral artery slightly dominant. Left posterior inferior cerebral artery patent. Right posterior inferior cerebral artery not well visualized. Basilar artery widely patent. Superior cerebellar arteries patent proximally. Left PCA arises from the basilar artery and is well opacified to its distal aspect. Fetal type right PCA supplied via the right posterior communicating artery. Right PCA is attenuated distally, and may be partially occluded. MRA NECK FINDINGS Visualized aortic arch is of normal caliber with normal branch  pattern. No high-grade stenosis at the origin of the great vessels. Mild atheromatous narrowing of the proximal left subclavian artery without significant stenosis. Right common carotid artery patent from its origin to the bifurcation. No significant atheromatous narrowing about the right bifurcation. Right ICA widely patent from the bifurcation to the skullbase. No flow limiting stenosis, dissection, or vascular occlusion within the right carotid artery system. Left common carotid artery widely patent from its origin to the bifurcation. No significant stenosis about the left bifurcation. Left ICA widely patent from the bifurcation to the skullbase. No flow limiting stenosis, dissection, or vascular occlusion within the left carotid artery system. Vertebral arteries both arise from the subclavian arteries.  Origin of the vertebral arteries not well seen on this exam. Vertebral arteries widely patent within the neck without stenosis, occlusion, or definite dissection. Left vertebral artery is dominant. IMPRESSION: MRI HEAD IMPRESSION: 1. No acute intracranial infarct or other process identified. 2. Age advanced cerebral atrophy with moderate chronic small vessel ischemic disease. 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 These findings are stable. MRA HEAD IMPRESSION: 1. No large or proximal arterial branch occlusion within the intracranial circulation. No high-grade or correctable stenosis. 2. Attenuation of the distal right PCA, which may be occluded. 3. 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 There is a fetal type right PCA. 4. Distal small vessel irregularity within the intracranial circulation. MRA NECK IMPRESSION: Negative MRA of the neck. No flow limiting or critical stenosis identified. Electronically Signed   By: Rise Mu M.D.   On: 06/13/2015 05:53     CBC  Recent Labs Lab 06/12/15 1600 06/13/15 0518 06/15/15 0334  WBC 6.3 4.7 6.7  HGB 12.6 11.9* 10.4*   HCT 38.9 36.0 31.8*  PLT 222 203 222  MCV 92.0 91.8 90.1  MCH 29.8 30.4 29.5  MCHC 32.4 33.1 32.7  RDW 13.7 13.9 13.4  LYMPHSABS 2.3  --   --   MONOABS 0.5  --   --   EOSABS 0.1  --   --   BASOSABS 0.0  --   --     Chemistries   Recent Labs Lab 06/12/15 1600 06/13/15 0518 06/14/15 0231 06/14/15 0356 06/15/15 0334  NA 144 145 145 147* 142  K 3.8 4.0 4.3 3.5 3.2*  CL 112* 115* 119* 119* 115*  CO2 21* 17* 16* 18* 17*  GLUCOSE 115* 78 66 67 103*  BUN 21* 18 16 16 10   CREATININE 0.88 0.62 0.67 0.63 0.56  CALCIUM 9.3 8.6* 8.5* 8.5* 8.6*  AST 18  --  18 13*  --   ALT 17  --  11* 11*  --   ALKPHOS 56  --  45 43  --   BILITOT 0.7  --  0.7 0.6  --    ------------------------------------------------------------------------------------------------------------------ estimated creatinine clearance is 43.4 mL/min (by C-G formula based on Cr of 0.56). ------------------------------------------------------------------------------------------------------------------ No results for input(s): HGBA1C in the last 72 hours. ------------------------------------------------------------------------------------------------------------------ No results for input(s): CHOL, HDL, LDLCALC, TRIG, CHOLHDL, LDLDIRECT in the last 72 hours. ------------------------------------------------------------------------------------------------------------------  Recent Labs  06/12/15 2000  TSH 2.838   ------------------------------------------------------------------------------------------------------------------ No results for input(s): VITAMINB12, FOLATE, FERRITIN, TIBC, IRON, RETICCTPCT in the last 72 hours.  Coagulation profile  Recent Labs Lab 06/12/15 2000  INR 1.32    No results for input(s): DDIMER in the last 72 hours.  Cardiac Enzymes  Recent Labs Lab 06/12/15 1735 06/12/15 2340 06/13/15 0518  TROPONINI 0.29* 0.23* 0.14*    ------------------------------------------------------------------------------------------------------------------ Invalid input(s): POCBNP   CBG: No results for input(s): GLUCAP in the last 168 hours.     Studies: No results found.    No results found for: HGBA1C Lab Results  Component Value Date   CREATININE 0.56 06/15/2015       Scheduled Meds: . aspirin  325 mg Oral Daily  . baclofen  5 mg Oral QHS  . [START ON 06/16/2015] heparin  5,000 Units Subcutaneous Q8H  . levETIRAcetam  750 mg Intravenous Q12H  . pravastatin  10 mg Oral q1800  . QUEtiapine  25 mg Oral QHS  . sodium chloride flush  3 mL Intravenous Q12H   Continuous Infusions: . dextrose 5 %  and 0.45% NaCl 75 mL/hr (06/15/15 0426)     LOS: 2 days    Time spent: >30 MINS    [facility]  [facility] Pager [phone]. If 7PM-7AM, please contact night-coverage at [email or web address], password [facility] 06/15/2015, 9:22 AM  LOS: 2 days

## 2015-06-15 NOTE — Sedation Documentation (Signed)
Patient is resting, moaning in pain.

## 2015-06-15 NOTE — Sedation Documentation (Signed)
Patient is resting comfortably. 

## 2015-06-15 NOTE — Consult Note (Signed)
  WOC wound consult note Reason for Consult:Pressure injury to sacrum Wound type:Stage 2 pressure injury to sacrum secondary to moisture Pressure Ulcer POA: Yes Measurement:8 cm x 6 cm  Wound bed: Pink Drainage (amount, consistency, odor) minimal/ serosanguinous  Periwound: Large area of maceration to surrounding skin. Patient is incontinent of bowel and bladder. Nurse to discuss foley placement with MD to prevent further sacral wound breakdown. Dressing procedure/placement/frequency: Cleanse sacral wound with normal saline. Pat dry. Cover with 5 x 5 Allevyn foam dressing. Change every five days and prn if soiled. Low air loss mattress for pressure relief Bilateral Prevalon boots while in bed for pressure relief to heels   Re consult if needed, will not follow at this time. Thanks, Lemmie EvensJacqueline Ramsie Ostrander MSN, RN, [employer]

## 2015-06-15 NOTE — Sedation Documentation (Signed)
Patient is resting. Moaning at times. Pt does not respond appropriately.

## 2015-06-15 NOTE — Sedation Documentation (Signed)
Vital signs stable. Pt resting 

## 2015-06-15 NOTE — Procedures (Signed)
RUE PICC 44 cm SVC RA No comp/EBL

## 2015-06-15 NOTE — Procedures (Signed)
20 Fr pull through g tube No comp/EBL

## 2015-06-15 NOTE — Progress Notes (Signed)
Spoke with Elmarie Shileyiffany, RN bedside nurse as well as IR regarding PICC placement while patient is in IR for G tube placement.  Patient is not cooperative and sterile field cannot be maintained for bedside placement.  IR states that they are willing and able to place while patient is there for G tube if husband decides that he wants to proceed with placement and MD changes order to IR placement of PICC.  Gasper LloydKerry Meckenzie Balsley, RN VAST

## 2015-06-15 NOTE — Progress Notes (Signed)
Initial Nutrition Assessment  DOCUMENTATION CODES:   Underweight  INTERVENTION:    Once G-tube ready to be used, initiate Jevity 1.2 formula at 15 ml/hr and increase by 10 ml every 6 hours until goal rate of 45 ml/hr   TF regimen to provide 1296 kcals, 60 gm protein, 876 ml of Angst water daily  NUTRITION DIAGNOSIS:   Inadequate oral intake related to inability to eat as evidenced by NPO status  GOAL:   Patient will meet greater than or equal to 90% of their needs  MONITOR:   TF tolerance, Labs, Weight trends, Skin, I & O's  REASON FOR ASSESSMENT:   Consult, Low Braden Enteral/tube feeding initiation and management  ASSESSMENT:   82 yo Female with a history of aphasia and concern for seizures.  RD unable to obtain nutrition hx; pt laying in bed moaning. Speech Path following for dysphagia treatments. Plan is for G-tube placement per IR. CWOCN note reviewed -- pt with Stage II sacral pressure injury. Unable to complete nutrition focused physical exam, however, pt is underweight.  Diet Order:  Diet NPO time specified  Skin:  Wound (see comment) (Stage II to sacrum)   Last BM:  4/27  Height:   Ht Readings from Last 1 Encounters:  06/15/15 5\' 5"  (1.651 m)    Weight:   Wt Readings from Last 1 Encounters:  06/15/15 110 lb 0.2 oz (49.9 kg)    Ideal Body Weight:  56.8 kg  BMI:  Body mass index is 18.31 kg/(m^2).  Estimated Nutritional Needs:   Kcal:  1200-1400  Protein:  60-70 gm  Fluid:  >/= 1.5 L  EDUCATION NEEDS:   No education needs identified at this time  Maureen ChattersKatie Deondrae Mcgrail, RD, LDN Pager #: [phone] After-Hours Pager #: [phone]

## 2015-06-15 NOTE — Care Management Important Message (Signed)
Important Message  Patient Details  Name: Carrie Riley MRN: [id number] Date of Birth: February 03, 1933   Medicare Important Message Given:  Yes    Darrold SpanWebster, Jowell Bossi Hall, RN 06/15/2015, 1:27 PM

## 2015-06-15 NOTE — Sedation Documentation (Signed)
No sedation given at this time. 

## 2015-06-15 NOTE — Progress Notes (Signed)
Attempted to place foley catheter and was unsuccessful x2. Dereck, RN attempted and was unsuccessful x1.  Paged MD for further instructions.  Minerva Endsiffany N Sophy Mesler

## 2015-06-15 NOTE — Clinical Documentation Improvement (Signed)
Hospitalist  Please document query responses in the progress notes and discharge summary, not on the CDI BPA form.   Thank you.  Query 1 of 3                (please scroll down) Please clarify if the patient has had an acute stroke this admission.  Query 2 of 3                (please scroll down) "Pain: Will try Tylenol prn for pain r/t buttocks pressure ulcer that family believes to a source of her decline r/t this pain." is documented in the Palliative Care consult.  Please clarify and document the Location, Stage and whether or not Present on Admission for the buttocks pressure ulcer documented by Palliative Care.  Query 3 of 3 "NSTEMI (non-ST elevated myocardial infarction)" is documented in the ED provider note.  Please clarify and document if NSTEMI:  - Is an appropriate diagnosis for this admission  - Is Not an appropriate diagnosis for the admission  - Other condition  - Unable to clinically determine  Please exercise your independent, professional judgment when responding. A specific answer is not anticipated or expected.   Thank You, Jerral Ralphathy R Shelie Lansing  RN BSN CCDS [phone] Health Information Management [place]

## 2015-06-15 NOTE — Sedation Documentation (Signed)
Vital signs stable. 

## 2015-06-15 NOTE — Sedation Documentation (Signed)
Patient is resting

## 2015-06-16 ENCOUNTER — Inpatient Hospital Stay (HOSPITAL_COMMUNITY): Payer: Medicare Other

## 2015-06-16 DIAGNOSIS — R55 Syncope and collapse: Secondary | ICD-10-CM

## 2015-06-16 LAB — COMPREHENSIVE METABOLIC PANEL
ALT: 9 U/L — AB (ref 14–54)
AST: 12 U/L — AB (ref 15–41)
Albumin: 2.1 g/dL — ABNORMAL LOW (ref 3.5–5.0)
Alkaline Phosphatase: 46 U/L (ref 38–126)
Anion gap: 8 (ref 5–15)
BUN: 7 mg/dL (ref 6–20)
CHLORIDE: 116 mmol/L — AB (ref 101–111)
CO2: 19 mmol/L — AB (ref 22–32)
CREATININE: 0.6 mg/dL (ref 0.44–1.00)
Calcium: 8.5 mg/dL — ABNORMAL LOW (ref 8.9–10.3)
GFR calc Af Amer: >60 mL/min (ref >60)
GLUCOSE: 153 mg/dL — AB (ref 65–99)
Potassium: 3.2 mmol/L — ABNORMAL LOW (ref 3.5–5.1)
Sodium: 143 mmol/L (ref 135–145)
Total Bilirubin: 0.7 mg/dL (ref 0.3–1.2)
Total Protein: 5.3 g/dL — ABNORMAL LOW (ref 6.5–8.1)

## 2015-06-16 LAB — CBC
HCT: 30.8 % — ABNORMAL LOW (ref 36.0–46.0)
Hemoglobin: 10.1 g/dL — ABNORMAL LOW (ref 12.0–15.0)
MCH: 29.7 pg (ref 26.0–34.0)
MCHC: 32.8 g/dL (ref 30.0–36.0)
MCV: 90.6 fL (ref 78.0–100.0)
PLATELETS: 205 10*3/uL (ref 150–400)
RBC: 3.4 MIL/uL — ABNORMAL LOW (ref 3.87–5.11)
RDW: 13.4 % (ref 11.5–15.5)
WBC: 7 10*3/uL (ref 4.0–10.5)

## 2015-06-16 LAB — ECHOCARDIOGRAM COMPLETE
Height: 65 in
WEIGHTICAEL: 1760.15 [oz_av]

## 2015-06-16 LAB — GLUCOSE, CAPILLARY
GLUCOSE-CAPILLARY: 101 mg/dL — AB (ref 65–99)
GLUCOSE-CAPILLARY: 138 mg/dL — AB (ref 65–99)
GLUCOSE-CAPILLARY: 147 mg/dL — AB (ref 65–99)

## 2015-06-16 MED ORDER — FUROSEMIDE 20 MG PO TABS
20.0000 mg | ORAL_TABLET | Freq: Every day | ORAL | Status: AC
  Administered 2015-06-16: 20 mg
  Filled 2015-06-16: qty 1

## 2015-06-16 MED ORDER — METOPROLOL TARTRATE 12.5 MG HALF TABLET
12.5000 mg | ORAL_TABLET | Freq: Two times a day (BID) | ORAL | Status: DC
  Administered 2015-06-16 – 2015-06-18 (×5): 12.5 mg
  Filled 2015-06-16 (×5): qty 1

## 2015-06-16 NOTE — Progress Notes (Signed)
Echocardiogram 2D Echocardiogram has been performed.  Dorothey BasemanReel, Jarron Curley M 06/16/2015, 8:57 AM

## 2015-06-16 NOTE — Progress Notes (Signed)
Referring Physician(s): Dr Richarda Overlie  Supervising Physician: Jolaine Click  Patient Status: In-pt  Chief Complaint: Dysphagia S/P Percutaneous gastrostomy tube placement by Dr. Bonnielee Haff 06/15/2015  Subjective:  Encephalopathy Non-verbal  Allergies: Ciprofloxacin; Other; and Citric acid  Medications: Prior to Admission medications   Medication Sig Start Date End Date Taking? Authorizing Provider  baclofen (LIORESAL) 10 MG tablet Take 5 mg by mouth at bedtime. For 30 days ending 06-05-15 05/05/15  Yes Historical Provider, MD  [facility] ASPIRIN 325 MG tablet Take 325 mg by mouth daily. 04/05/15  Yes Historical Provider, MD  docusate sodium (COLACE) 100 MG capsule Take 100 mg by mouth 2 (two) times daily. 05/04/15  Yes Historical Provider, MD  gabapentin (NEURONTIN) 100 MG capsule Take 100 mg by mouth 3 (three) times daily. 05/04/15  Yes Historical Provider, MD  levETIRAcetam (KEPPRA) 500 MG tablet Take 1 tablet (500 mg total) by mouth 2 (two) times daily. 05/13/15  Yes Rhetta Mura, MD  lisinopril (PRINIVIL,ZESTRIL) 5 MG tablet Take 5 mg by mouth daily. 04/25/15  Yes Historical Provider, MD  megestrol (MEGACE) 40 MG tablet Take 40 mg by mouth 2 (two) times daily. 05/04/15  Yes Historical Provider, MD  Multiple Vitamin (MULTIVITAMIN) tablet Take 1 tablet by mouth daily.   Yes Historical Provider, MD  pravastatin (PRAVACHOL) 10 MG tablet Take 10 mg by mouth daily at 6 PM. 04/25/15  Yes Historical Provider, MD  traZODone (DESYREL) 50 MG tablet Take 50 mg by mouth at bedtime. 05/08/15  Yes Historical Provider, MD  azithromycin (ZITHROMAX) 250 MG tablet Take 2 tabs PO x 1 dose, then 1 tab PO QD x 4 days Patient not taking: Reported on 05/13/2015 12/11/14   Elvina Sidle, MD  celecoxib (CELEBREX) 100 MG capsule Take 1 capsule (100 mg total) by mouth 2 (two) times daily. Patient not taking: Reported on 05/13/2015 12/11/14   Elvina Sidle, MD  HYDROcodone-acetaminophen (NORCO/VICODIN) 5-325 MG  tablet Take 2 tablets by mouth every 4 (four) hours as needed. Patient not taking: Reported on 05/13/2015 12/01/14   Samantha Tripp Dowless, PA-C  HYDROcodone-homatropine [facility]) 5-1.5 MG/5ML syrup Take 5 mLs by mouth every 8 (eight) hours as needed for cough. Patient not taking: Reported on 05/13/2015 12/11/14   Elvina Sidle, MD  sertraline (ZOLOFT) 25 MG tablet Take 1 tablet (25 mg total) by mouth daily. Patient not taking: Reported on 12/11/2014 07/30/13   [name], MD     Vital Signs: BP 137/82 mmHg  Pulse 121  Temp(Src) 97.5 F (36.4 C) (Axillary)  Resp 12  Ht 5\' 5"  (1.651 m)  Wt 110 lb 0.2 oz (49.9 kg)  BMI 18.31 kg/m2  SpO2 100%  Physical Exam Abdomen soft G-tube site looks good Tube feeds started, no issues.  Imaging: Ct Abdomen Wo Contrast  06/15/2015  CLINICAL DATA:  Gastrostomy tube workup EXAM: CT ABDOMEN WITHOUT CONTRAST TECHNIQUE: Multidetector CT imaging of the abdomen was performed following the standard protocol without IV contrast. COMPARISON:  None. FINDINGS: Linear atelectasis at the left lung base. Left main, lad, and circumflex coronary artery calcification. The gastric body is well opposed to the anterior abdominal wall. Transverse colon resides below the gastric body. Anatomy is favorable for gastrostomy tube placement. Liver is unremarkable Pancreas is nonvisualized Spleen is unremarkable. Adrenal glands and kidneys are grossly within normal limits Pancreas is poorly visualized but is grossly within normal limits. Moderate stool burden throughout the transverse colon. Uterus is not clearly visualized and may have been resected. Unremarkable bladder  and sigmoid colon. 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 There is anterolisthesis L4 upon L5. No pars defect. Left side of L5 is sacralized. There are degenerative changes in the hip joints. Atherosclerotic calcifications of the aorta and iliac vasculature are noted. IMPRESSION: Anatomy is  favorable for gastrostomy tube placement No acute intra-abdominal pathology. Moderate stool burden. INDICATION: Chest Electronically Signed   By: Jolaine Click M.D.   On: 06/15/2015 09:29   Dg Chest 2 View  06/12/2015  CLINICAL DATA:  Stroke.  Right-sided deficits and aphasia. EXAM: CHEST  2 VIEW COMPARISON:  05/11/2015 FINDINGS: The heart size and mediastinal contours are within normal limits. The pulmonary arteries appear prominent suggestive of PA hypertension. Both lungs are clear. The visualized skeletal structures are unremarkable. IMPRESSION: 1. No acute findings. 2. Prominent pulmonary arteries suggestive of PA hypertension. Electronically Signed   By: Signa Kell M.D.   On: 06/12/2015 16:34   Ct Head Wo Contrast  06/12/2015  CLINICAL DATA:  Altered mental status EXAM: CT HEAD WITHOUT CONTRAST TECHNIQUE: Contiguous axial images were obtained from the base of the skull through the vertex without intravenous contrast. COMPARISON:  05/12/2015 FINDINGS: The bony calvarium is intact. Atrophic changes and chronic white matter ischemic changes are seen similar to that noted on prior MRI examination. No findings to suggest acute hemorrhage, acute infarction or space-occupying mass lesion are noted. IMPRESSION: No acute abnormality noted. Electronically Signed   By: Alcide Clever M.D.   On: 06/12/2015 16:56   Mr Maxine Glenn Head Wo Contrast  06/13/2015  CLINICAL DATA:  Initial evaluation for progressive right-sided weakness and aphasia with episodes of unresponsiveness. EXAM: MRI HEAD WITHOUT CONTRAST MRA HEAD WITHOUT CONTRAST MRA NECK WITHOUT AND WITH CONTRAST TECHNIQUE: Multiplanar, multiecho pulse sequences of the brain and surrounding structures were obtained without intravenous contrast. Angiographic images of the Circle of Willis were obtained using MRA technique without intravenous contrast. Angiographic images of the neck were obtained using MRA technique without and with intravenous contrast. Carotid  stenosis measurements (when applicable) are obtained utilizing NASCET criteria, using the distal internal carotid diameter as the denominator. CONTRAST:  10mL MULTIHANCE GADOBENATE DIMEGLUMINE 529 MG/ML IV SOLN COMPARISON:  Previous CT from 06/12/2015 as well as previous brain MRI from 05/12/2015. FINDINGS: MRI HEAD FINDINGS Study somewhat degraded by motion artifact. Diffuse prominence of the CSF containing spaces compatible with generalized cerebral atrophy, fairly advanced in nature. Patchy T2/FLAIR hyperintensity within the periventricular and deep white matter both cerebral hemispheres most likely related chronic small vessel ischemic disease, moderate nature. Chronic small vessel ischemic type changes present within the pons. Cortical/subcortical encephalomalacia within the parasagittal left frontal lobe consistent with remote infarct, relatively small in nature, and stable relative to previous MRI. No other remote infarcts identified. No abnormal foci of restricted diffusion to suggest acute infarct. Gray-white matter differentiation maintained. Major intracranial vascular flow voids are preserved. No acute intracranial hemorrhage. Small focus susceptibility air that within a left superior cerebellar vermis likely reflects a small chronic micro hemorrhage. No mass lesion, midline shift, or mass effect. Ventricular prominence related to global parenchymal volume loss of hydrocephalus. No extra-axial fluid collection. Major dural sinuses are patent. Craniocervical junction within normal limits. Visualized upper cervical spine unremarkable. Incidental note made of a partially empty sella. No acute abnormality about the orbits. Sequela prior lens extraction on the right. Paranasal sinuses are clear. No mastoid effusion. Inner ear structures normal. Bone marrow signal intensity within normal limits. No scalp soft tissue abnormality. MRA HEAD FINDINGS  ANTERIOR CIRCULATION: Visualized distal cervical segments of the  internal carotid arteries are patent with antegrade flow. Petrous segments patent. Mild atheromatous irregularity within the cavernous and supraclinoid ICAs without flow-limiting stenosis. ICA termini widely patent. There is a a saccular aneurysm at the level of the right posterior communicating artery measuring approximately 8 x 6 mm (series 4, image 36). A1 segments patent. Anterior communicating artery normal. Anterior cerebral arteries are opacified to their distal aspect without definite flow limiting stenosis. Irregularity within the mid A2 segments favored to be related to motion. Right M1 segment patent without stenosis or occlusion. Right MCA bifurcation normal. Distal small vessel atheromatous irregularity within the right MCA branches. The proximal and mid aspect of the left A1 segment are widely patent. Absent signal void within the distal left M1 segment, favored to be artifactual, as a normal flow void is seen within this region on on the T2 weighted sequence of the corresponding brain. Flow was seen distally within the left MCA branches, but demonstrate multifocal atheromatous irregularity. POSTERIOR CIRCULATION: Vertebral arteries patent to the vertebrobasilar junction. Left vertebral artery slightly dominant. Left posterior inferior cerebral artery patent. Right posterior inferior cerebral artery not well visualized. Basilar artery widely patent. Superior cerebellar arteries patent proximally. Left PCA arises from the basilar artery and is well opacified to its distal aspect. Fetal type right PCA supplied via the right posterior communicating artery. Right PCA is attenuated distally, and may be partially occluded. MRA NECK FINDINGS Visualized aortic arch is of normal caliber with normal branch pattern. No high-grade stenosis at the origin of the great vessels. Mild atheromatous narrowing of the proximal left subclavian artery without significant stenosis. Right common carotid artery patent from its  origin to the bifurcation. No significant atheromatous narrowing about the right bifurcation. Right ICA widely patent from the bifurcation to the skullbase. No flow limiting stenosis, dissection, or vascular occlusion within the right carotid artery system. Left common carotid artery widely patent from its origin to the bifurcation. No significant stenosis about the left bifurcation. Left ICA widely patent from the bifurcation to the skullbase. No flow limiting stenosis, dissection, or vascular occlusion within the left carotid artery system. Vertebral arteries both arise from the subclavian arteries. Origin of the vertebral arteries not well seen on this exam. Vertebral arteries widely patent within the neck without stenosis, occlusion, or definite dissection. Left vertebral artery is dominant. IMPRESSION: MRI HEAD IMPRESSION: 1. No acute intracranial infarct or other process identified. 2. Age advanced cerebral atrophy with moderate chronic small vessel ischemic disease. 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 These findings are stable. MRA HEAD IMPRESSION: 1. No large or proximal arterial branch occlusion within the intracranial circulation. No high-grade or correctable stenosis. 2. Attenuation of the distal right PCA, which may be occluded. 3. 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 There is a fetal type right PCA. 4. Distal small vessel irregularity within the intracranial circulation. MRA NECK IMPRESSION: Negative MRA of the neck. No flow limiting or critical stenosis identified. Electronically Signed   By: Rise Mu M.D.   On: 06/13/2015 05:53   Mr Angiogram Neck W Wo Contrast  06/13/2015  CLINICAL DATA:  Initial evaluation for progressive right-sided weakness and aphasia with episodes of unresponsiveness. EXAM: MRI HEAD WITHOUT CONTRAST MRA HEAD WITHOUT CONTRAST MRA NECK WITHOUT AND WITH CONTRAST TECHNIQUE: Multiplanar, multiecho pulse sequences of the brain and  surrounding structures were obtained without intravenous contrast. Angiographic images of the Circle of Willis were obtained using MRA  technique without intravenous contrast. Angiographic images of the neck were obtained using MRA technique without and with intravenous contrast. Carotid stenosis measurements (when applicable) are obtained utilizing NASCET criteria, using the distal internal carotid diameter as the denominator. CONTRAST:  10mL MULTIHANCE GADOBENATE DIMEGLUMINE 529 MG/ML IV SOLN COMPARISON:  Previous CT from 06/12/2015 as well as previous brain MRI from 05/12/2015. FINDINGS: MRI HEAD FINDINGS Study somewhat degraded by motion artifact. Diffuse prominence of the CSF containing spaces compatible with generalized cerebral atrophy, fairly advanced in nature. Patchy T2/FLAIR hyperintensity within the periventricular and deep white matter both cerebral hemispheres most likely related chronic small vessel ischemic disease, moderate nature. Chronic small vessel ischemic type changes present within the pons. Cortical/subcortical encephalomalacia within the parasagittal left frontal lobe consistent with remote infarct, relatively small in nature, and stable relative to previous MRI. No other remote infarcts identified. No abnormal foci of restricted diffusion to suggest acute infarct. Gray-white matter differentiation maintained. Major intracranial vascular flow voids are preserved. No acute intracranial hemorrhage. Small focus susceptibility air that within a left superior cerebellar vermis likely reflects a small chronic micro hemorrhage. No mass lesion, midline shift, or mass effect. Ventricular prominence related to global parenchymal volume loss of hydrocephalus. No extra-axial fluid collection. Major dural sinuses are patent. Craniocervical junction within normal limits. Visualized upper cervical spine unremarkable. Incidental note made of a partially empty sella. No acute abnormality about the orbits.  Sequela prior lens extraction on the right. Paranasal sinuses are clear. No mastoid effusion. Inner ear structures normal. Bone marrow signal intensity within normal limits. No scalp soft tissue abnormality. MRA HEAD FINDINGS ANTERIOR CIRCULATION: Visualized distal cervical segments of the internal carotid arteries are patent with antegrade flow. Petrous segments patent. Mild atheromatous irregularity within the cavernous and supraclinoid ICAs without flow-limiting stenosis. ICA termini widely patent. There is a a saccular aneurysm at the level of the right posterior communicating artery measuring approximately 8 x 6 mm (series 4, image 36). A1 segments patent. Anterior communicating artery normal. Anterior cerebral arteries are opacified to their distal aspect without definite flow limiting stenosis. Irregularity within the mid A2 segments favored to be related to motion. Right M1 segment patent without stenosis or occlusion. Right MCA bifurcation normal. Distal small vessel atheromatous irregularity within the right MCA branches. The proximal and mid aspect of the left A1 segment are widely patent. Absent signal void within the distal left M1 segment, favored to be artifactual, as a normal flow void is seen within this region on on the T2 weighted sequence of the corresponding brain. Flow was seen distally within the left MCA branches, but demonstrate multifocal atheromatous irregularity. POSTERIOR CIRCULATION: Vertebral arteries patent to the vertebrobasilar junction. Left vertebral artery slightly dominant. Left posterior inferior cerebral artery patent. Right posterior inferior cerebral artery not well visualized. Basilar artery widely patent. Superior cerebellar arteries patent proximally. Left PCA arises from the basilar artery and is well opacified to its distal aspect. Fetal type right PCA supplied via the right posterior communicating artery. Right PCA is attenuated distally, and may be partially occluded.  MRA NECK FINDINGS Visualized aortic arch is of normal caliber with normal branch pattern. No high-grade stenosis at the origin of the great vessels. Mild atheromatous narrowing of the proximal left subclavian artery without significant stenosis. Right common carotid artery patent from its origin to the bifurcation. No significant atheromatous narrowing about the right bifurcation. Right ICA widely patent from the bifurcation to the skullbase. No flow limiting stenosis, dissection, or vascular occlusion within the  right carotid artery system. Left common carotid artery widely patent from its origin to the bifurcation. No significant stenosis about the left bifurcation. Left ICA widely patent from the bifurcation to the skullbase. No flow limiting stenosis, dissection, or vascular occlusion within the left carotid artery system. Vertebral arteries both arise from the subclavian arteries. Origin of the vertebral arteries not well seen on this exam. Vertebral arteries widely patent within the neck without stenosis, occlusion, or definite dissection. Left vertebral artery is dominant. IMPRESSION: MRI HEAD IMPRESSION: 1. No acute intracranial infarct or other process identified. 2. Age advanced cerebral atrophy with moderate chronic small vessel ischemic disease. 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 These findings are stable. MRA HEAD IMPRESSION: 1. No large or proximal arterial branch occlusion within the intracranial circulation. No high-grade or correctable stenosis. 2. Attenuation of the distal right PCA, which may be occluded. 3. 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 There is a fetal type right PCA. 4. Distal small vessel irregularity within the intracranial circulation. MRA NECK IMPRESSION: Negative MRA of the neck. No flow limiting or critical stenosis identified. Electronically Signed   By: Rise Mu M.D.   On: 06/13/2015 05:53   Mr Brain Wo Contrast  06/13/2015   CLINICAL DATA:  Initial evaluation for progressive right-sided weakness and aphasia with episodes of unresponsiveness. EXAM: MRI HEAD WITHOUT CONTRAST MRA HEAD WITHOUT CONTRAST MRA NECK WITHOUT AND WITH CONTRAST TECHNIQUE: Multiplanar, multiecho pulse sequences of the brain and surrounding structures were obtained without intravenous contrast. Angiographic images of the Circle of Willis were obtained using MRA technique without intravenous contrast. Angiographic images of the neck were obtained using MRA technique without and with intravenous contrast. Carotid stenosis measurements (when applicable) are obtained utilizing NASCET criteria, using the distal internal carotid diameter as the denominator. CONTRAST:  10mL MULTIHANCE GADOBENATE DIMEGLUMINE 529 MG/ML IV SOLN COMPARISON:  Previous CT from 06/12/2015 as well as previous brain MRI from 05/12/2015. FINDINGS: MRI HEAD FINDINGS Study somewhat degraded by motion artifact. Diffuse prominence of the CSF containing spaces compatible with generalized cerebral atrophy, fairly advanced in nature. Patchy T2/FLAIR hyperintensity within the periventricular and deep white matter both cerebral hemispheres most likely related chronic small vessel ischemic disease, moderate nature. Chronic small vessel ischemic type changes present within the pons. Cortical/subcortical encephalomalacia within the parasagittal left frontal lobe consistent with remote infarct, relatively small in nature, and stable relative to previous MRI. No other remote infarcts identified. No abnormal foci of restricted diffusion to suggest acute infarct. Gray-white matter differentiation maintained. Major intracranial vascular flow voids are preserved. No acute intracranial hemorrhage. Small focus susceptibility air that within a left superior cerebellar vermis likely reflects a small chronic micro hemorrhage. No mass lesion, midline shift, or mass effect. Ventricular prominence related to global  parenchymal volume loss of hydrocephalus. No extra-axial fluid collection. Major dural sinuses are patent. Craniocervical junction within normal limits. Visualized upper cervical spine unremarkable. Incidental note made of a partially empty sella. No acute abnormality about the orbits. Sequela prior lens extraction on the right. Paranasal sinuses are clear. No mastoid effusion. Inner ear structures normal. Bone marrow signal intensity within normal limits. No scalp soft tissue abnormality. MRA HEAD FINDINGS ANTERIOR CIRCULATION: Visualized distal cervical segments of the internal carotid arteries are patent with antegrade flow. Petrous segments patent. Mild atheromatous irregularity within the cavernous and supraclinoid ICAs without flow-limiting stenosis. ICA termini widely patent. There is a a saccular aneurysm at the level of the right posterior communicating artery measuring  approximately 8 x 6 mm (series 4, image 36). A1 segments patent. Anterior communicating artery normal. Anterior cerebral arteries are opacified to their distal aspect without definite flow limiting stenosis. Irregularity within the mid A2 segments favored to be related to motion. Right M1 segment patent without stenosis or occlusion. Right MCA bifurcation normal. Distal small vessel atheromatous irregularity within the right MCA branches. The proximal and mid aspect of the left A1 segment are widely patent. Absent signal void within the distal left M1 segment, favored to be artifactual, as a normal flow void is seen within this region on on the T2 weighted sequence of the corresponding brain. Flow was seen distally within the left MCA branches, but demonstrate multifocal atheromatous irregularity. POSTERIOR CIRCULATION: Vertebral arteries patent to the vertebrobasilar junction. Left vertebral artery slightly dominant. Left posterior inferior cerebral artery patent. Right posterior inferior cerebral artery not well visualized. Basilar artery  widely patent. Superior cerebellar arteries patent proximally. Left PCA arises from the basilar artery and is well opacified to its distal aspect. Fetal type right PCA supplied via the right posterior communicating artery. Right PCA is attenuated distally, and may be partially occluded. MRA NECK FINDINGS Visualized aortic arch is of normal caliber with normal branch pattern. No high-grade stenosis at the origin of the great vessels. Mild atheromatous narrowing of the proximal left subclavian artery without significant stenosis. Right common carotid artery patent from its origin to the bifurcation. No significant atheromatous narrowing about the right bifurcation. Right ICA widely patent from the bifurcation to the skullbase. No flow limiting stenosis, dissection, or vascular occlusion within the right carotid artery system. Left common carotid artery widely patent from its origin to the bifurcation. No significant stenosis about the left bifurcation. Left ICA widely patent from the bifurcation to the skullbase. No flow limiting stenosis, dissection, or vascular occlusion within the left carotid artery system. Vertebral arteries both arise from the subclavian arteries. Origin of the vertebral arteries not well seen on this exam. Vertebral arteries widely patent within the neck without stenosis, occlusion, or definite dissection. Left vertebral artery is dominant. IMPRESSION: MRI HEAD IMPRESSION: 1. No acute intracranial infarct or other process identified. 2. Age advanced cerebral atrophy with moderate chronic small vessel ischemic disease. 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 These findings are stable. MRA HEAD IMPRESSION: 1. No large or proximal arterial branch occlusion within the intracranial circulation. No high-grade or correctable stenosis. 2. Attenuation of the distal right PCA, which may be occluded. 3. 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 There is a fetal type right  PCA. 4. Distal small vessel irregularity within the intracranial circulation. MRA NECK IMPRESSION: Negative MRA of the neck. No flow limiting or critical stenosis identified. Electronically Signed   By: Rise Mu M.D.   On: 06/13/2015 05:53   Ir Gastrostomy Tube Mod Sed  06/15/2015  INDICATION: Stroke EXAM: PERC PLACEMENT GASTROSTOMY MEDICATIONS: ; Antibiotics were administered within 1 hour of the procedure. Glucagon 1 mg IV ANESTHESIA/SEDATION: Versed 1 mg IV; Fentanyl 75 mcg IV Moderate Sedation Time:  10 The patient was continuously monitored during the procedure by the interventional radiology nurse under my direct supervision. CONTRAST:  10 cc Omnipaque 300 - administered into the gastric lumen. FLUOROSCOPY TIME:  Fluoroscopy Time: 6 minutes 0 seconds (8 mGy). COMPLICATIONS: None immediate. PROCEDURE: The procedure, risks, benefits, and alternatives were explained to the patient. Questions regarding the procedure were encouraged and answered. The patient understands and consents to the procedure. The epigastrium was prepped  with Betadine in a sterile fashion, and a sterile drape was applied covering the operative field. A sterile gown and sterile gloves were used for the procedure. A 5-French orogastric tube is placed under fluoroscopic guidance. Scout imaging of the abdomen confirms barium within the transverse colon. The stomach was distended with gas. Under fluoroscopic guidance, an 18 gauge needle was utilized to puncture the anterior wall of the body of the stomach. An Amplatz wire was advanced through the needle passing a T fastener into the lumen of the stomach. The T fastener was secured for gastropexy. A 9-French sheath was inserted. A snare was advanced through the 9-French sheath. A Teena Dunk was advanced through the orogastric tube. It was snared then pulled out the oral cavity, pulling the snare, as well. The leading edge of the gastrostomy was attached to the snare. It was then pulled  down the esophagus and out the percutaneous site. It was secured in place. Contrast was injected. The image demonstrates placement of a 20-French pull-through type gastrostomy tube into the body of the stomach. IMPRESSION: Successful 20 French pull-through gastrostomy. Electronically Signed   By: Jolaine Click M.D.   On: 06/15/2015 14:36   Ir US Guide Vasc Access Right  06/15/2015  INDICATION: Poor IV access EXAM: RIGHT UPPER EXTREMITY PICC LINE PLACEMENT WITH ULTRASOUND AND FLUOROSCOPIC GUIDANCE MEDICATIONS: ; The antibiotic was administered within an appropriate time interval prior to skin puncture. ANESTHESIA/SEDATION: None FLUOROSCOPY TIME:  Fluoroscopy Time:  minutes 12 seconds ( mGy). COMPLICATIONS: None immediate. PROCEDURE: The patient was advised of the possible risks and complications and agreed to undergo the procedure. The patient was then brought to the angiographic suite for the procedure. The right arm was prepped with chlorhexidine, draped in the usual sterile fashion using maximum barrier technique (cap and mask, sterile gown, sterile gloves, large sterile sheet, hand hygiene and cutaneous antisepsis) and infiltrated locally with 1% Lidocaine. Ultrasound demonstrated patency of the right basilar vein, and this was documented with an image. Under real-time ultrasound guidance, this vein was accessed with a 21 gauge micropuncture needle and image documentation was performed. A 0.018 wire was introduced in to the vein. Over this, a 5 [place] double lumen power PICC was advanced to the lower SVC/right atrial junction. Fluoroscopy during the procedure and fluoro spot radiograph confirms appropriate catheter position. The catheter was flushed and covered with asterile dressing. Catheter length: 44 cm IMPRESSION: Successful right arm power PICC line placement with ultrasound and fluoroscopic guidance. The catheter is ready for use. Electronically Signed   By: Jolaine Click M.D.   On: 06/15/2015 14:36    Ir Fluoro Guide Cv Midline Picc Right  06/15/2015  INDICATION: Poor IV access EXAM: RIGHT UPPER EXTREMITY PICC LINE PLACEMENT WITH ULTRASOUND AND FLUOROSCOPIC GUIDANCE MEDICATIONS: ; The antibiotic was administered within an appropriate time interval prior to skin puncture. ANESTHESIA/SEDATION: None FLUOROSCOPY TIME:  Fluoroscopy Time:  minutes 12 seconds ( mGy). COMPLICATIONS: None immediate. PROCEDURE: The patient was advised of the possible risks and complications and agreed to undergo the procedure. The patient was then brought to the angiographic suite for the procedure. The right arm was prepped with chlorhexidine, draped in the usual sterile fashion using maximum barrier technique (cap and mask, sterile gown, sterile gloves, large sterile sheet, hand hygiene and cutaneous antisepsis) and infiltrated locally with 1% Lidocaine. Ultrasound demonstrated patency of the right basilar vein, and this was documented with an image. Under real-time ultrasound guidance, this vein was accessed with a 21 gauge micropuncture  needle and image documentation was performed. A 0.018 wire was introduced in to the vein. Over this, a 5 [place] double lumen power PICC was advanced to the lower SVC/right atrial junction. Fluoroscopy during the procedure and fluoro spot radiograph confirms appropriate catheter position. The catheter was flushed and covered with asterile dressing. Catheter length: 44 cm IMPRESSION: Successful right arm power PICC line placement with ultrasound and fluoroscopic guidance. The catheter is ready for use. Electronically Signed   By: Jolaine Click M.D.   On: 06/15/2015 14:36    Labs:  CBC:  Recent Labs  06/12/15 1600 06/13/15 0518 06/15/15 0334 06/16/15 0455  WBC 6.3 4.7 6.7 7.0  HGB 12.6 11.9* 10.4* 10.1*  HCT 38.9 36.0 31.8* 30.8*  PLT 222 203 222 205    COAGS:  Recent Labs  06/12/15 2000  INR 1.32    BMP:  Recent Labs  06/14/15 0231 06/14/15 0356 06/15/15 0334  06/16/15 0455  NA 145 147* 142 143  K 4.3 3.5 3.2* 3.2*  CL 119* 119* 115* 116*  CO2 16* 18* 17* 19*  GLUCOSE 66 67 103* 153*  BUN CALCIUM 8.5* 8.5* 8.6* 8.5*  CREATININE 0.67 0.63 0.56 0.60  GFRNONAA >60 >60 >60 >60  GFRAA >60 >60 >60 >60    LIVER FUNCTION TESTS:  Recent Labs  06/12/15 1600 06/14/15 0231 06/14/15 0356 06/16/15 0455  BILITOT 0.7 0.7 0.6 0.7  AST 18 18 13* 12*  ALT 17 11* 11* 9*  ALKPHOS 56 45 43 46  PROT 6.9 5.4* 5.4* 5.3*  ALBUMIN 3.0* 2.3* 2.2* 2.1*    Assessment and Plan:  Dysphagia Encephalopathy S/P perc gastrostomy tube by Dr. Bonnielee Haff 06/15/2015 Routine G-tube care Will sign off.  Electronically Signed: Gwynneth Macleod PA-C 06/16/2015, 12:32 PM   I spent a total of 15 Minutes at the the patient's bedside AND on the patient's hospital floor or unit, greater than 50% of which was counseling/coordinating care for post g-tube

## 2015-06-16 NOTE — Progress Notes (Signed)
[facility] PROGRESS NOTE  Corissa Achord WUJ:[id number] DOB: 1932/08/28 DOA: 06/12/2015   PCP: No PCP Per Patient  Brief summary 82 y.o. female, With history of large left frontal lobe ischemic CVA in the past, aphasic at baseline, dense right-sided hemiparesis with bedbound status at baseline,presents with transient episode of unresponsiveness. She was being seen by a home health aide who apparently observed that she had sudden episode of staring. He called the husband advised that they called 911. She apparently has had several of these episodes in the past, and was started on Keppra for them one month ago. Patient also has a history of essential hypertension, dyslipidemia, depression, UTI in the past, questionable seizures who lives at home, is bedbound, is aphasic at baseline,. In the ER patient was found to be hypotensive, initial lab work was unremarkable, UA suspicious for UTI but specimen likely was not of good quality, she is afebrile, with the help of husband limited review of systems was obtained as below but was unremarkable. After receiving some IV fluid in the ER patient is already improving and getting close to her baseline.  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 Chest x-ray negative TSH normal, UA negative Physical therapy evaluation-recommends hospital bed Continue to hold Neurontin, trazodone, continue low-dose baclofen to prevent withdrawal, however patient not taking by mouth medications Patient has previously refused all interventions when seen by her neurologist 09/13/14 ,[name], MD -S/p palliative  meeting, family wants full aggressive scope of Rx, Full Code  -Doubt UTI,  -UA not impressive, no fevers, leukocytosis -Off Ceftriaxone anyway, will not resume  Hypokalemia-replete, check magnesium  Dyslipidemia-continue statin  History of CVA-, continue nothing by mouth, palliative care consult, Started seroquel /Haldol for agitation -PEG placed, s/p family meeting  History of seizures-continue Keppra iv   Troponin elevation without non-ST elevation MI - EKG non acute, denies Chest pain, Trend Trop, ASA-Statin to continue. Tele. -FU  2-D echo to rule out wall motion abnormalities  Sinus Tachycardia -will add low dose Oral metoprolol -FU echo  Swelling/third spacing, all over esp RUE -check dopplers -stop IVf, getting Tube feeds -single dose of lasix now  Pressure ulcer-wound care consultation ordered and staging is pending   DVT prophylaxsis heparin  Code Status:  Full code Family Communication: Spouse at bedside Disposition Plan:  Home vs SNF in 2-3days  Consultants:  Neurology  Palliative care    Procedures:  None  Antibiotics:       HPI/Subjective: Feels ok, tolerating Tfs, no issues overnight, swollen extremities  Objective: Filed Vitals:   06/15/15 1413 06/15/15 1415 06/15/15 1500 06/16/15 0637  BP: 129/64 135/74 124/67 137/82  Pulse: 97 92 94 121  Temp:   97.8 F (36.6 C) 97.5 F (36.4 C)  TempSrc:   Oral Axillary  Resp: 11 11 12    Height:      Weight:      SpO2: 100% 100% 100% 100%    Intake/Output Summary (Last 24 hours) at 06/16/15 1105 Last data filed at 06/15/15 1500  Gross per 24 hour  Intake      0 ml  Output  0 ml  Net      0 ml    Exam:  Examination:  General exam: awake, non verbal, mouth open, tracks Respiratory system: Clear to auscultation. Respiratory effort normal. Cardiovascular system: S1 & S2 heard, sinus tach . Gastrointestinal system: Abdomen is nondistended, soft and nontender. No organomegaly or  masses felt. Normal bowel sounds heard. G tube noted Central nervous system: Alert only Extremities: swelling of all ext, esp RUE Skin: No rashes, lesions or ulcers Psychiatry: unable to assess    Data Reviewed: I have personally reviewed following labs and imaging studies  Micro Results No results found for this or any previous visit (from the past 240 hour(s)).  Radiology Reports Ct Abdomen Wo Contrast  06/15/2015  CLINICAL DATA:  Gastrostomy tube workup EXAM: CT ABDOMEN WITHOUT CONTRAST TECHNIQUE: Multidetector CT imaging of the abdomen was performed following the standard protocol without IV contrast. COMPARISON:  None. FINDINGS: Linear atelectasis at the left lung base. Left main, lad, and circumflex coronary artery calcification. The gastric body is well opposed to the anterior abdominal wall. Transverse colon resides below the gastric body. Anatomy is favorable for gastrostomy tube placement. Liver is unremarkable Pancreas is nonvisualized Spleen is unremarkable. Adrenal glands and kidneys are grossly within normal limits Pancreas is poorly visualized but is grossly within normal limits. Moderate stool burden throughout the transverse colon. Uterus is not clearly visualized and may have been resected. Unremarkable bladder and sigmoid colon. 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 There is anterolisthesis L4 upon L5. No pars defect. Left side of L5 is sacralized. There are degenerative changes in the hip joints. Atherosclerotic calcifications of the aorta and iliac vasculature are noted. IMPRESSION: Anatomy is favorable for gastrostomy tube placement No acute intra-abdominal pathology. Moderate stool burden. INDICATION: Chest Electronically Signed   By: Jolaine Click M.D.   On: 06/15/2015 09:29   Dg Chest 2 View  06/12/2015  CLINICAL DATA:  Stroke.  Right-sided deficits and aphasia. EXAM: CHEST  2 VIEW COMPARISON:  05/11/2015 FINDINGS: The heart size and mediastinal contours  are within normal limits. The pulmonary arteries appear prominent suggestive of PA hypertension. Both lungs are clear. The visualized skeletal structures are unremarkable. IMPRESSION: 1. No acute findings. 2. Prominent pulmonary arteries suggestive of PA hypertension. Electronically Signed   By: Signa Kell M.D.   On: 06/12/2015 16:34   Ct Head Wo Contrast  06/12/2015  CLINICAL DATA:  Altered mental status EXAM: CT HEAD WITHOUT CONTRAST TECHNIQUE: Contiguous axial images were obtained from the base of the skull through the vertex without intravenous contrast. COMPARISON:  05/12/2015 FINDINGS: The bony calvarium is intact. Atrophic changes and chronic white matter ischemic changes are seen similar to that noted on prior MRI examination. No findings to suggest acute hemorrhage, acute infarction or space-occupying mass lesion are noted. IMPRESSION: No acute abnormality noted. Electronically Signed   By: Alcide Clever M.D.   On: 06/12/2015 16:56   Mr Maxine Glenn Head Wo Contrast  06/13/2015  CLINICAL DATA:  Initial evaluation for progressive right-sided weakness and aphasia with episodes of unresponsiveness. EXAM: MRI HEAD WITHOUT CONTRAST MRA HEAD WITHOUT CONTRAST MRA NECK WITHOUT AND WITH CONTRAST TECHNIQUE: Multiplanar, multiecho pulse sequences of the brain and surrounding structures were obtained without intravenous contrast. Angiographic images of the Circle of Willis were obtained using MRA technique without intravenous contrast. Angiographic images of the neck were obtained using MRA technique without and with intravenous contrast. Carotid stenosis measurements (when applicable) are obtained utilizing NASCET criteria, using the  distal internal carotid diameter as the denominator. CONTRAST:  10mL MULTIHANCE GADOBENATE DIMEGLUMINE 529 MG/ML IV SOLN COMPARISON:  Previous CT from 06/12/2015 as well as previous brain MRI from 05/12/2015. FINDINGS: MRI HEAD FINDINGS Study somewhat degraded by motion artifact. Diffuse  prominence of the CSF containing spaces compatible with generalized cerebral atrophy, fairly advanced in nature. Patchy T2/FLAIR hyperintensity within the periventricular and deep white matter both cerebral hemispheres most likely related chronic small vessel ischemic disease, moderate nature. Chronic small vessel ischemic type changes present within the pons. Cortical/subcortical encephalomalacia within the parasagittal left frontal lobe consistent with remote infarct, relatively small in nature, and stable relative to previous MRI. No other remote infarcts identified. No abnormal foci of restricted diffusion to suggest acute infarct. Gray-white matter differentiation maintained. Major intracranial vascular flow voids are preserved. No acute intracranial hemorrhage. Small focus susceptibility air that within a left superior cerebellar vermis likely reflects a small chronic micro hemorrhage. No mass lesion, midline shift, or mass effect. Ventricular prominence related to global parenchymal volume loss of hydrocephalus. No extra-axial fluid collection. Major dural sinuses are patent. Craniocervical junction within normal limits. Visualized upper cervical spine unremarkable. Incidental note made of a partially empty sella. No acute abnormality about the orbits. Sequela prior lens extraction on the right. Paranasal sinuses are clear. No mastoid effusion. Inner ear structures normal. Bone marrow signal intensity within normal limits. No scalp soft tissue abnormality. MRA HEAD FINDINGS ANTERIOR CIRCULATION: Visualized distal cervical segments of the internal carotid arteries are patent with antegrade flow. Petrous segments patent. Mild atheromatous irregularity within the cavernous and supraclinoid ICAs without flow-limiting stenosis. ICA termini widely patent. There is a a saccular aneurysm at the level of the right posterior communicating artery measuring approximately 8 x 6 mm (series 4, image 36). A1 segments patent.  Anterior communicating artery normal. Anterior cerebral arteries are opacified to their distal aspect without definite flow limiting stenosis. Irregularity within the mid A2 segments favored to be related to motion. Right M1 segment patent without stenosis or occlusion. Right MCA bifurcation normal. Distal small vessel atheromatous irregularity within the right MCA branches. The proximal and mid aspect of the left A1 segment are widely patent. Absent signal void within the distal left M1 segment, favored to be artifactual, as a normal flow void is seen within this region on on the T2 weighted sequence of the corresponding brain. Flow was seen distally within the left MCA branches, but demonstrate multifocal atheromatous irregularity. POSTERIOR CIRCULATION: Vertebral arteries patent to the vertebrobasilar junction. Left vertebral artery slightly dominant. Left posterior inferior cerebral artery patent. Right posterior inferior cerebral artery not well visualized. Basilar artery widely patent. Superior cerebellar arteries patent proximally. Left PCA arises from the basilar artery and is well opacified to its distal aspect. Fetal type right PCA supplied via the right posterior communicating artery. Right PCA is attenuated distally, and may be partially occluded. MRA NECK FINDINGS Visualized aortic arch is of normal caliber with normal branch pattern. No high-grade stenosis at the origin of the great vessels. Mild atheromatous narrowing of the proximal left subclavian artery without significant stenosis. Right common carotid artery patent from its origin to the bifurcation. No significant atheromatous narrowing about the right bifurcation. Right ICA widely patent from the bifurcation to the skullbase. No flow limiting stenosis, dissection, or vascular occlusion within the right carotid artery system. Left common carotid artery widely patent from its origin to the bifurcation. No significant stenosis about the left  bifurcation. Left ICA widely patent from the bifurcation  to the skullbase. No flow limiting stenosis, dissection, or vascular occlusion within the left carotid artery system. Vertebral arteries both arise from the subclavian arteries. Origin of the vertebral arteries not well seen on this exam. Vertebral arteries widely patent within the neck without stenosis, occlusion, or definite dissection. Left vertebral artery is dominant. IMPRESSION: MRI HEAD IMPRESSION: 1. No acute intracranial infarct or other process identified. 2. Age advanced cerebral atrophy with moderate chronic small vessel ischemic disease. 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 These findings are stable. MRA HEAD IMPRESSION: 1. No large or proximal arterial branch occlusion within the intracranial circulation. No high-grade or correctable stenosis. 2. Attenuation of the distal right PCA, which may be occluded. 3. 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 There is a fetal type right PCA. 4. Distal small vessel irregularity within the intracranial circulation. MRA NECK IMPRESSION: Negative MRA of the neck. No flow limiting or critical stenosis identified. Electronically Signed   By: Rise Mu M.D.   On: 06/13/2015 05:53   Mr Angiogram Neck W Wo Contrast  06/13/2015  CLINICAL DATA:  Initial evaluation for progressive right-sided weakness and aphasia with episodes of unresponsiveness. EXAM: MRI HEAD WITHOUT CONTRAST MRA HEAD WITHOUT CONTRAST MRA NECK WITHOUT AND WITH CONTRAST TECHNIQUE: Multiplanar, multiecho pulse sequences of the brain and surrounding structures were obtained without intravenous contrast. Angiographic images of the Circle of Willis were obtained using MRA technique without intravenous contrast. Angiographic images of the neck were obtained using MRA technique without and with intravenous contrast. Carotid stenosis measurements (when applicable) are obtained utilizing NASCET criteria, using  the distal internal carotid diameter as the denominator. CONTRAST:  10mL MULTIHANCE GADOBENATE DIMEGLUMINE 529 MG/ML IV SOLN COMPARISON:  Previous CT from 06/12/2015 as well as previous brain MRI from 05/12/2015. FINDINGS: MRI HEAD FINDINGS Study somewhat degraded by motion artifact. Diffuse prominence of the CSF containing spaces compatible with generalized cerebral atrophy, fairly advanced in nature. Patchy T2/FLAIR hyperintensity within the periventricular and deep white matter both cerebral hemispheres most likely related chronic small vessel ischemic disease, moderate nature. Chronic small vessel ischemic type changes present within the pons. Cortical/subcortical encephalomalacia within the parasagittal left frontal lobe consistent with remote infarct, relatively small in nature, and stable relative to previous MRI. No other remote infarcts identified. No abnormal foci of restricted diffusion to suggest acute infarct. Gray-white matter differentiation maintained. Major intracranial vascular flow voids are preserved. No acute intracranial hemorrhage. Small focus susceptibility air that within a left superior cerebellar vermis likely reflects a small chronic micro hemorrhage. No mass lesion, midline shift, or mass effect. Ventricular prominence related to global parenchymal volume loss of hydrocephalus. No extra-axial fluid collection. Major dural sinuses are patent. Craniocervical junction within normal limits. Visualized upper cervical spine unremarkable. Incidental note made of a partially empty sella. No acute abnormality about the orbits. Sequela prior lens extraction on the right. Paranasal sinuses are clear. No mastoid effusion. Inner ear structures normal. Bone marrow signal intensity within normal limits. No scalp soft tissue abnormality. MRA HEAD FINDINGS ANTERIOR CIRCULATION: Visualized distal cervical segments of the internal carotid arteries are patent with antegrade flow. Petrous segments patent.  Mild atheromatous irregularity within the cavernous and supraclinoid ICAs without flow-limiting stenosis. ICA termini widely patent. There is a a saccular aneurysm at the level of the right posterior communicating artery measuring approximately 8 x 6 mm (series 4, image 36). A1 segments patent. Anterior communicating artery normal. Anterior cerebral arteries are opacified to their distal aspect without definite flow  limiting stenosis. Irregularity within the mid A2 segments favored to be related to motion. Right M1 segment patent without stenosis or occlusion. Right MCA bifurcation normal. Distal small vessel atheromatous irregularity within the right MCA branches. The proximal and mid aspect of the left A1 segment are widely patent. Absent signal void within the distal left M1 segment, favored to be artifactual, as a normal flow void is seen within this region on on the T2 weighted sequence of the corresponding brain. Flow was seen distally within the left MCA branches, but demonstrate multifocal atheromatous irregularity. POSTERIOR CIRCULATION: Vertebral arteries patent to the vertebrobasilar junction. Left vertebral artery slightly dominant. Left posterior inferior cerebral artery patent. Right posterior inferior cerebral artery not well visualized. Basilar artery widely patent. Superior cerebellar arteries patent proximally. Left PCA arises from the basilar artery and is well opacified to its distal aspect. Fetal type right PCA supplied via the right posterior communicating artery. Right PCA is attenuated distally, and may be partially occluded. MRA NECK FINDINGS Visualized aortic arch is of normal caliber with normal branch pattern. No high-grade stenosis at the origin of the great vessels. Mild atheromatous narrowing of the proximal left subclavian artery without significant stenosis. Right common carotid artery patent from its origin to the bifurcation. No significant atheromatous narrowing about the right  bifurcation. Right ICA widely patent from the bifurcation to the skullbase. No flow limiting stenosis, dissection, or vascular occlusion within the right carotid artery system. Left common carotid artery widely patent from its origin to the bifurcation. No significant stenosis about the left bifurcation. Left ICA widely patent from the bifurcation to the skullbase. No flow limiting stenosis, dissection, or vascular occlusion within the left carotid artery system. Vertebral arteries both arise from the subclavian arteries. Origin of the vertebral arteries not well seen on this exam. Vertebral arteries widely patent within the neck without stenosis, occlusion, or definite dissection. Left vertebral artery is dominant. IMPRESSION: MRI HEAD IMPRESSION: 1. No acute intracranial infarct or other process identified. 2. Age advanced cerebral atrophy with moderate chronic small vessel ischemic disease. 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 These findings are stable. MRA HEAD IMPRESSION: 1. No large or proximal arterial branch occlusion within the intracranial circulation. No high-grade or correctable stenosis. 2. Attenuation of the distal right PCA, which may be occluded. 3. 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 There is a fetal type right PCA. 4. Distal small vessel irregularity within the intracranial circulation. MRA NECK IMPRESSION: Negative MRA of the neck. No flow limiting or critical stenosis identified. Electronically Signed   By: Rise MuBenjamin  McClintock M.D.   On: 06/13/2015 05:53   Mr Brain Wo Contrast  06/13/2015  CLINICAL DATA:  Initial evaluation for progressive right-sided weakness and aphasia with episodes of unresponsiveness. EXAM: MRI HEAD WITHOUT CONTRAST MRA HEAD WITHOUT CONTRAST MRA NECK WITHOUT AND WITH CONTRAST TECHNIQUE: Multiplanar, multiecho pulse sequences of the brain and surrounding structures were obtained without intravenous contrast. Angiographic images of  the Circle of Willis were obtained using MRA technique without intravenous contrast. Angiographic images of the neck were obtained using MRA technique without and with intravenous contrast. Carotid stenosis measurements (when applicable) are obtained utilizing NASCET criteria, using the distal internal carotid diameter as the denominator. CONTRAST:  10mL MULTIHANCE GADOBENATE DIMEGLUMINE 529 MG/ML IV SOLN COMPARISON:  Previous CT from 06/12/2015 as well as previous brain MRI from 05/12/2015. FINDINGS: MRI HEAD FINDINGS Study somewhat degraded by motion artifact. Diffuse prominence of the CSF containing spaces compatible with generalized  cerebral atrophy, fairly advanced in nature. Patchy T2/FLAIR hyperintensity within the periventricular and deep white matter both cerebral hemispheres most likely related chronic small vessel ischemic disease, moderate nature. Chronic small vessel ischemic type changes present within the pons. Cortical/subcortical encephalomalacia within the parasagittal left frontal lobe consistent with remote infarct, relatively small in nature, and stable relative to previous MRI. No other remote infarcts identified. No abnormal foci of restricted diffusion to suggest acute infarct. Gray-white matter differentiation maintained. Major intracranial vascular flow voids are preserved. No acute intracranial hemorrhage. Small focus susceptibility air that within a left superior cerebellar vermis likely reflects a small chronic micro hemorrhage. No mass lesion, midline shift, or mass effect. Ventricular prominence related to global parenchymal volume loss of hydrocephalus. No extra-axial fluid collection. Major dural sinuses are patent. Craniocervical junction within normal limits. Visualized upper cervical spine unremarkable. Incidental note made of a partially empty sella. No acute abnormality about the orbits. Sequela prior lens extraction on the right. Paranasal sinuses are clear. No mastoid  effusion. Inner ear structures normal. Bone marrow signal intensity within normal limits. No scalp soft tissue abnormality. MRA HEAD FINDINGS ANTERIOR CIRCULATION: Visualized distal cervical segments of the internal carotid arteries are patent with antegrade flow. Petrous segments patent. Mild atheromatous irregularity within the cavernous and supraclinoid ICAs without flow-limiting stenosis. ICA termini widely patent. There is a a saccular aneurysm at the level of the right posterior communicating artery measuring approximately 8 x 6 mm (series 4, image 36). A1 segments patent. Anterior communicating artery normal. Anterior cerebral arteries are opacified to their distal aspect without definite flow limiting stenosis. Irregularity within the mid A2 segments favored to be related to motion. Right M1 segment patent without stenosis or occlusion. Right MCA bifurcation normal. Distal small vessel atheromatous irregularity within the right MCA branches. The proximal and mid aspect of the left A1 segment are widely patent. Absent signal void within the distal left M1 segment, favored to be artifactual, as a normal flow void is seen within this region on on the T2 weighted sequence of the corresponding brain. Flow was seen distally within the left MCA branches, but demonstrate multifocal atheromatous irregularity. POSTERIOR CIRCULATION: Vertebral arteries patent to the vertebrobasilar junction. Left vertebral artery slightly dominant. Left posterior inferior cerebral artery patent. Right posterior inferior cerebral artery not well visualized. Basilar artery widely patent. Superior cerebellar arteries patent proximally. Left PCA arises from the basilar artery and is well opacified to its distal aspect. Fetal type right PCA supplied via the right posterior communicating artery. Right PCA is attenuated distally, and may be partially occluded. MRA NECK FINDINGS Visualized aortic arch is of normal caliber with normal branch  pattern. No high-grade stenosis at the origin of the great vessels. Mild atheromatous narrowing of the proximal left subclavian artery without significant stenosis. Right common carotid artery patent from its origin to the bifurcation. No significant atheromatous narrowing about the right bifurcation. Right ICA widely patent from the bifurcation to the skullbase. No flow limiting stenosis, dissection, or vascular occlusion within the right carotid artery system. Left common carotid artery widely patent from its origin to the bifurcation. No significant stenosis about the left bifurcation. Left ICA widely patent from the bifurcation to the skullbase. No flow limiting stenosis, dissection, or vascular occlusion within the left carotid artery system. Vertebral arteries both arise from the subclavian arteries. Origin of the vertebral arteries not well seen on this exam. Vertebral arteries widely patent within the neck without stenosis, occlusion, or definite dissection. Left vertebral artery  is dominant. IMPRESSION: MRI HEAD IMPRESSION: 1. No acute intracranial infarct or other process identified. 2. Age advanced cerebral atrophy with moderate chronic small vessel ischemic disease. 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 These findings are stable. MRA HEAD IMPRESSION: 1. No large or proximal arterial branch occlusion within the intracranial circulation. No high-grade or correctable stenosis. 2. Attenuation of the distal right PCA, which may be occluded. 3. 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 There is a fetal type right PCA. 4. Distal small vessel irregularity within the intracranial circulation. MRA NECK IMPRESSION: Negative MRA of the neck. No flow limiting or critical stenosis identified. Electronically Signed   By: Rise Mu M.D.   On: 06/13/2015 05:53   Ir Gastrostomy Tube Mod Sed  06/15/2015  INDICATION: Stroke EXAM: PERC PLACEMENT GASTROSTOMY MEDICATIONS: ;  Antibiotics were administered within 1 hour of the procedure. Glucagon 1 mg IV ANESTHESIA/SEDATION: Versed 1 mg IV; Fentanyl 75 mcg IV Moderate Sedation Time:  10 The patient was continuously monitored during the procedure by the interventional radiology nurse under my direct supervision. CONTRAST:  10 cc Omnipaque 300 - administered into the gastric lumen. FLUOROSCOPY TIME:  Fluoroscopy Time: 6 minutes 0 seconds (8 mGy). COMPLICATIONS: None immediate. PROCEDURE: The procedure, risks, benefits, and alternatives were explained to the patient. Questions regarding the procedure were encouraged and answered. The patient understands and consents to the procedure. The epigastrium was prepped with Betadine in a sterile fashion, and a sterile drape was applied covering the operative field. A sterile gown and sterile gloves were used for the procedure. A 5-French orogastric tube is placed under fluoroscopic guidance. Scout imaging of the abdomen confirms barium within the transverse colon. The stomach was distended with gas. Under fluoroscopic guidance, an 18 gauge needle was utilized to puncture the anterior wall of the body of the stomach. An Amplatz wire was advanced through the needle passing a T fastener into the lumen of the stomach. The T fastener was secured for gastropexy. A 9-French sheath was inserted. A snare was advanced through the 9-French sheath. A Teena Dunk was advanced through the orogastric tube. It was snared then pulled out the oral cavity, pulling the snare, as well. The leading edge of the gastrostomy was attached to the snare. It was then pulled down the esophagus and out the percutaneous site. It was secured in place. Contrast was injected. The image demonstrates placement of a 20-French pull-through type gastrostomy tube into the body of the stomach. IMPRESSION: Successful 20 French pull-through gastrostomy. Electronically Signed   By: Jolaine Click M.D.   On: 06/15/2015 14:36   Ir US Guide Vasc  Access Right  06/15/2015  INDICATION: Poor IV access EXAM: RIGHT UPPER EXTREMITY PICC LINE PLACEMENT WITH ULTRASOUND AND FLUOROSCOPIC GUIDANCE MEDICATIONS: ; The antibiotic was administered within an appropriate time interval prior to skin puncture. ANESTHESIA/SEDATION: None FLUOROSCOPY TIME:  Fluoroscopy Time:  minutes 12 seconds ( mGy). COMPLICATIONS: None immediate. PROCEDURE: The patient was advised of the possible risks and complications and agreed to undergo the procedure. The patient was then brought to the angiographic suite for the procedure. The right arm was prepped with chlorhexidine, draped in the usual sterile fashion using maximum barrier technique (cap and mask, sterile gown, sterile gloves, large sterile sheet, hand hygiene and cutaneous antisepsis) and infiltrated locally with 1% Lidocaine. Ultrasound demonstrated patency of the right basilar vein, and this was documented with an image. Under real-time ultrasound guidance, this vein was accessed with a 21 gauge micropuncture  needle and image documentation was performed. A 0.018 wire was introduced in to the vein. Over this, a 5 [place] double lumen power PICC was advanced to the lower SVC/right atrial junction. Fluoroscopy during the procedure and fluoro spot radiograph confirms appropriate catheter position. The catheter was flushed and covered with asterile dressing. Catheter length: 44 cm IMPRESSION: Successful right arm power PICC line placement with ultrasound and fluoroscopic guidance. The catheter is ready for use. Electronically Signed   By: Jolaine Click M.D.   On: 06/15/2015 14:36   Ir Fluoro Guide Cv Midline Picc Right  06/15/2015  INDICATION: Poor IV access EXAM: RIGHT UPPER EXTREMITY PICC LINE PLACEMENT WITH ULTRASOUND AND FLUOROSCOPIC GUIDANCE MEDICATIONS: ; The antibiotic was administered within an appropriate time interval prior to skin puncture. ANESTHESIA/SEDATION: None FLUOROSCOPY TIME:  Fluoroscopy Time:  minutes 12 seconds (  mGy). COMPLICATIONS: None immediate. PROCEDURE: The patient was advised of the possible risks and complications and agreed to undergo the procedure. The patient was then brought to the angiographic suite for the procedure. The right arm was prepped with chlorhexidine, draped in the usual sterile fashion using maximum barrier technique (cap and mask, sterile gown, sterile gloves, large sterile sheet, hand hygiene and cutaneous antisepsis) and infiltrated locally with 1% Lidocaine. Ultrasound demonstrated patency of the right basilar vein, and this was documented with an image. Under real-time ultrasound guidance, this vein was accessed with a 21 gauge micropuncture needle and image documentation was performed. A 0.018 wire was introduced in to the vein. Over this, a 5 [place] double lumen power PICC was advanced to the lower SVC/right atrial junction. Fluoroscopy during the procedure and fluoro spot radiograph confirms appropriate catheter position. The catheter was flushed and covered with asterile dressing. Catheter length: 44 cm IMPRESSION: Successful right arm power PICC line placement with ultrasound and fluoroscopic guidance. The catheter is ready for use. Electronically Signed   By: Jolaine Click M.D.   On: 06/15/2015 14:36     CBC  Recent Labs Lab 06/12/15 1600 06/13/15 0518 06/15/15 0334 06/16/15 0455  WBC 6.3 4.7 6.7 7.0  HGB 12.6 11.9* 10.4* 10.1*  HCT 38.9 36.0 31.8* 30.8*  PLT 222 203 222 205  MCV 92.0 91.8 90.1 90.6  MCH 29.8 30.4 29.5 29.7  MCHC 32.4 33.1 32.7 32.8  RDW 13.7 13.9 13.4 13.4  LYMPHSABS 2.3  --   --   --   MONOABS 0.5  --   --   --   EOSABS 0.1  --   --   --   BASOSABS 0.0  --   --   --     Chemistries   Recent Labs Lab 06/12/15 1600 06/13/15 0518 06/14/15 0231 06/14/15 0356 06/15/15 0334 06/15/15 0949 06/16/15 0455  NA 144 145 145 147* 142  --  143  K 3.8 4.0 4.3 3.5 3.2*  --  3.2*  CL 112* 115* 119* 119* 115*  --  116*  CO2 21* 17* 16* 18* 17*  --   19*  GLUCOSE 115* 78 66 67 103*  --  153*  BUN 21* --  7  CREATININE 0.88 0.62 0.67 0.63 0.56  --  0.60  CALCIUM 9.3 8.6* 8.5* 8.5* 8.6*  --  8.5*  MG  --   --   --   --   --  1.6*  --   AST 18  --  18 13*  --   --  12*  ALT 17  --  11* 11*  --   --  9*  ALKPHOS 56  --  45 43  --   --  46  BILITOT 0.7  --  0.7 0.6  --   --  0.7   ------------------------------------------------------------------------------------------------------------------ estimated creatinine clearance is 43.4 mL/min (by C-G formula based on Cr of 0.6). ------------------------------------------------------------------------------------------------------------------ No results for input(s): HGBA1C in the last 72 hours. ------------------------------------------------------------------------------------------------------------------ No results for input(s): CHOL, HDL, LDLCALC, TRIG, CHOLHDL, LDLDIRECT in the last 72 hours. ------------------------------------------------------------------------------------------------------------------ No results for input(s): TSH, T4TOTAL, T3FREE, THYROIDAB in the last 72 hours.  Invalid input(s): FREET3 ------------------------------------------------------------------------------------------------------------------ No results for input(s): VITAMINB12, FOLATE, FERRITIN, TIBC, IRON, RETICCTPCT in the last 72 hours.  Coagulation profile  Recent Labs Lab 06/12/15 2000  INR 1.32    No results for input(s): DDIMER in the last 72 hours.  Cardiac Enzymes  Recent Labs Lab 06/12/15 1735 06/12/15 2340 06/13/15 0518  TROPONINI 0.29* 0.23* 0.14*   ------------------------------------------------------------------------------------------------------------------ Invalid input(s): POCBNP   CBG:  Recent Labs Lab 06/16/15 0023 06/16/15 0623  GLUCAP 101* 138*       Studies: Ct Abdomen Wo Contrast  06/30/2015  CLINICAL DATA:  Gastrostomy tube workup EXAM:  CT ABDOMEN WITHOUT CONTRAST TECHNIQUE: Multidetector CT imaging of the abdomen was performed following the standard protocol without IV contrast. COMPARISON:  None. FINDINGS: Linear atelectasis at the left lung base. Left main, lad, and circumflex coronary artery calcification. The gastric body is well opposed to the anterior abdominal wall. Transverse colon resides below the gastric body. Anatomy is favorable for gastrostomy tube placement. Liver is unremarkable Pancreas is nonvisualized Spleen is unremarkable. Adrenal glands and kidneys are grossly within normal limits Pancreas is poorly visualized but is grossly within normal limits. Moderate stool burden throughout the transverse colon. Uterus is not clearly visualized and may have been resected. Unremarkable bladder and sigmoid colon. 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 There is anterolisthesis L4 upon L5. No pars defect. Left side of L5 is sacralized. There are degenerative changes in the hip joints. Atherosclerotic calcifications of the aorta and iliac vasculature are noted. IMPRESSION: Anatomy is favorable for gastrostomy tube placement No acute intra-abdominal pathology. Moderate stool burden. INDICATION: Chest Electronically Signed   By: Jolaine Click M.D.   On: 06/30/15 09:29   Ir Gastrostomy Tube Mod Sed  Jun 30, 2015  INDICATION: Stroke EXAM: PERC PLACEMENT GASTROSTOMY MEDICATIONS: ; Antibiotics were administered within 1 hour of the procedure. Glucagon 1 mg IV ANESTHESIA/SEDATION: Versed 1 mg IV; Fentanyl 75 mcg IV Moderate Sedation Time:  10 The patient was continuously monitored during the procedure by the interventional radiology nurse under my direct supervision. CONTRAST:  10 cc Omnipaque 300 - administered into the gastric lumen. FLUOROSCOPY TIME:  Fluoroscopy Time: 6 minutes 0 seconds (8 mGy). COMPLICATIONS: None immediate. PROCEDURE: The procedure, risks, benefits, and alternatives were explained to the patient. Questions  regarding the procedure were encouraged and answered. The patient understands and consents to the procedure. The epigastrium was prepped with Betadine in a sterile fashion, and a sterile drape was applied covering the operative field. A sterile gown and sterile gloves were used for the procedure. A 5-French orogastric tube is placed under fluoroscopic guidance. Scout imaging of the abdomen confirms barium within the transverse colon. The stomach was distended with gas. Under fluoroscopic guidance, an 18 gauge needle was utilized to puncture the anterior wall of the body of the stomach. An Amplatz wire was advanced through the needle passing a T fastener into the lumen of the  stomach. The T fastener was secured for gastropexy. A 9-French sheath was inserted. A snare was advanced through the 9-French sheath. A Teena Dunk was advanced through the orogastric tube. It was snared then pulled out the oral cavity, pulling the snare, as well. The leading edge of the gastrostomy was attached to the snare. It was then pulled down the esophagus and out the percutaneous site. It was secured in place. Contrast was injected. The image demonstrates placement of a 20-French pull-through type gastrostomy tube into the body of the stomach. IMPRESSION: Successful 20 French pull-through gastrostomy. Electronically Signed   By: Jolaine Click M.D.   On: 06/15/2015 14:36   Ir US Guide Vasc Access Right  06/15/2015  INDICATION: Poor IV access EXAM: RIGHT UPPER EXTREMITY PICC LINE PLACEMENT WITH ULTRASOUND AND FLUOROSCOPIC GUIDANCE MEDICATIONS: ; The antibiotic was administered within an appropriate time interval prior to skin puncture. ANESTHESIA/SEDATION: None FLUOROSCOPY TIME:  Fluoroscopy Time:  minutes 12 seconds ( mGy). COMPLICATIONS: None immediate. PROCEDURE: The patient was advised of the possible risks and complications and agreed to undergo the procedure. The patient was then brought to the angiographic suite for the procedure. The  right arm was prepped with chlorhexidine, draped in the usual sterile fashion using maximum barrier technique (cap and mask, sterile gown, sterile gloves, large sterile sheet, hand hygiene and cutaneous antisepsis) and infiltrated locally with 1% Lidocaine. Ultrasound demonstrated patency of the right basilar vein, and this was documented with an image. Under real-time ultrasound guidance, this vein was accessed with a 21 gauge micropuncture needle and image documentation was performed. A 0.018 wire was introduced in to the vein. Over this, a 5 [place] double lumen power PICC was advanced to the lower SVC/right atrial junction. Fluoroscopy during the procedure and fluoro spot radiograph confirms appropriate catheter position. The catheter was flushed and covered with asterile dressing. Catheter length: 44 cm IMPRESSION: Successful right arm power PICC line placement with ultrasound and fluoroscopic guidance. The catheter is ready for use. Electronically Signed   By: Jolaine Click M.D.   On: 06/15/2015 14:36   Ir Fluoro Guide Cv Midline Picc Right  06/15/2015  INDICATION: Poor IV access EXAM: RIGHT UPPER EXTREMITY PICC LINE PLACEMENT WITH ULTRASOUND AND FLUOROSCOPIC GUIDANCE MEDICATIONS: ; The antibiotic was administered within an appropriate time interval prior to skin puncture. ANESTHESIA/SEDATION: None FLUOROSCOPY TIME:  Fluoroscopy Time:  minutes 12 seconds ( mGy). COMPLICATIONS: None immediate. PROCEDURE: The patient was advised of the possible risks and complications and agreed to undergo the procedure. The patient was then brought to the angiographic suite for the procedure. The right arm was prepped with chlorhexidine, draped in the usual sterile fashion using maximum barrier technique (cap and mask, sterile gown, sterile gloves, large sterile sheet, hand hygiene and cutaneous antisepsis) and infiltrated locally with 1% Lidocaine. Ultrasound demonstrated patency of the right basilar vein, and this was  documented with an image. Under real-time ultrasound guidance, this vein was accessed with a 21 gauge micropuncture needle and image documentation was performed. A 0.018 wire was introduced in to the vein. Over this, a 5 [place] double lumen power PICC was advanced to the lower SVC/right atrial junction. Fluoroscopy during the procedure and fluoro spot radiograph confirms appropriate catheter position. The catheter was flushed and covered with asterile dressing. Catheter length: 44 cm IMPRESSION: Successful right arm power PICC line placement with ultrasound and fluoroscopic guidance. The catheter is ready for use. Electronically Signed   By: Jolaine Click M.D.   On: 06/15/2015 14:36  No results found for: HGBA1C Lab Results  Component Value Date   CREATININE 0.60 06/16/2015       Scheduled Meds: . aspirin  325 mg Oral Daily  . baclofen  5 mg Oral QHS  . furosemide  20 mg Per Tube Daily  . heparin  5,000 Units Subcutaneous Q8H  . levETIRAcetam  750 mg Intravenous Q12H  . metoprolol tartrate  12.5 mg Per Tube BID  . pravastatin  10 mg Oral q1800  . QUEtiapine  25 mg Oral QHS  . sodium chloride flush  3 mL Intravenous Q12H   Continuous Infusions: . feeding supplement (JEVITY 1.2 CAL) 1,000 mL (06/16/15 1018)     LOS: 3 days    Time spent: >30 MINS    [facility]  [facility] Pager [phone]. If 7PM-7AM, please contact night-coverage at [email or web address], password [facility] 06/16/2015, 11:05 AM  LOS: 3 days

## 2015-06-17 ENCOUNTER — Inpatient Hospital Stay (HOSPITAL_COMMUNITY): Payer: Medicare Other

## 2015-06-17 DIAGNOSIS — I82409 Acute embolism and thrombosis of unspecified deep veins of unspecified lower extremity: Secondary | ICD-10-CM

## 2015-06-17 LAB — GLUCOSE, CAPILLARY
GLUCOSE-CAPILLARY: 114 mg/dL — AB (ref 65–99)
GLUCOSE-CAPILLARY: 113 mg/dL — AB (ref 65–99)
GLUCOSE-CAPILLARY: 120 mg/dL — AB (ref 65–99)
GLUCOSE-CAPILLARY: 109 mg/dL — AB (ref 65–99)
Glucose-Capillary: 116 mg/dL — ABNORMAL HIGH (ref 65–99)
Glucose-Capillary: 114 mg/dL — ABNORMAL HIGH (ref 65–99)

## 2015-06-17 MED ORDER — LEVETIRACETAM 750 MG PO TABS
750.0000 mg | ORAL_TABLET | Freq: Two times a day (BID) | ORAL | Status: DC
  Administered 2015-06-17 – 2015-06-18 (×3): 750 mg via ORAL
  Filled 2015-06-17 (×4): qty 1

## 2015-06-17 NOTE — Progress Notes (Signed)
[facility] PROGRESS NOTE  Carrie Riley XBJ:[id number] DOB: Jul 28, 1932 DOA: 06/12/2015   PCP: No PCP Per Patient  Brief summary 82 y.o. female, With history of large left frontal lobe ischemic CVA in the past, aphasic at baseline, dense right-sided hemiparesis with bedbound status at baseline,presents with transient episode of unresponsiveness. She was being seen by a home health aide who apparently observed that she had sudden episode of staring. He called the husband advised that they called 911. She apparently has had several of these episodes in the past, and was started on Keppra for them one month ago. Patient also has a history of essential hypertension, dyslipidemia, depression, UTI in the past, questionable seizures who lives at home, is bedbound, is aphasic at baseline,. In the ER patient was found to be hypotensive, initial lab work was unremarkable, UA suspicious for UTI but specimen likely was not of good quality, she is afebrile, with the help of husband limited review of systems was obtained as below but was unremarkable. After receiving some IV fluid in the ER patient is already improving and getting close to her baseline.  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 Chest x-ray negative TSH normal, UA negative Physical therapy evaluation-recommends hospital bed Continue to hold Neurontin, trazodone, continue low-dose baclofen to prevent withdrawal, however patient not taking by mouth medications Patient has previously refused all interventions when seen by her neurologist 09/13/14 ,[name], MD -S/p palliative meeting, family wants full aggressive scope of Rx, Full Code     -Doubt UTI,  -UA not impressive, no fevers, leukocytosis -Off Ceftriaxone anyway, will not resume  Hypokalemia-replete, check magnesium  Dyslipidemia-continue statin  History of CVA-, continue nothing by mouth, palliative care consult, Started seroquel /Haldol for agitation -PEG placed, s/p family meeting, continue tube feeds, Jevity 1.2 CAL  formula at 15 ml/hr and increase by 10 ml every 6 hours until goal rate of 45 ml/hr, currently at goal  History of seizures-continue Keppra iv , switch to via PEG   Troponin elevation without non-ST elevation MI - EKG non acute, denies Chest pain, Trend Trop, ASA-Statin to continue. Tele. 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 -stop IVf, getting Tube feeds -single dose of lasix now  Wound type:Stage 2 pressure injury to sacrum secondary to [facility] care consultation ordered and staging is pending     DVT prophylaxsis heparin  Code Status:  Full code Family Communication: Spouse at bedside Disposition Plan:  Home vs SNF in 2-3days ,Disposition likely home with home health  Consultants:  Neurology  Palliative care    Procedures:  None  Antibiotics:       HPI/Subjective: Again moaning and groaning this morning,Very unhappy when the patient has moved or touched   Objective: Filed Vitals:   06/16/15 0637 06/16/15 1556 06/16/15 2056 06/17/15 0509  BP: 137/82 162/74 117/58 114/61  Pulse: 121 96 94 86  Temp: 97.5 F (36.4 C) 98  F (36.7 C) 98.1 F (36.7 C) 98.2 F (36.8 C)  TempSrc: Axillary Oral Axillary Axillary  Resp:  18 18 18   Height:      Weight:    52.935 kg (116 lb 11.2 oz)  SpO2: 100% 100% 100% 100%   No intake or  output data in the 24 hours ending 06/17/15 0945  Exam:  Examination:  General exam: awake, non verbal, mouth open, tracks Respiratory system: Clear to auscultation. Respiratory effort normal. Cardiovascular system: S1 & S2 heard, sinus tach . Gastrointestinal system: Abdomen is nondistended, soft and nontender. No organomegaly or masses felt. Normal bowel sounds heard. G tube noted Central nervous system: Alert only Extremities: swelling of all ext, esp RUE Skin: No rashes, lesions or ulcers Psychiatry: unable to assess    Data Reviewed: I have personally reviewed following labs and imaging studies  Micro Results No results found for this or any previous visit (from the past 240 hour(s)).  Radiology Reports Ct Abdomen Wo Contrast  06/15/2015  CLINICAL DATA:  Gastrostomy tube workup EXAM: CT ABDOMEN WITHOUT CONTRAST TECHNIQUE: Multidetector CT imaging of the abdomen was performed following the standard protocol without IV contrast. COMPARISON:  None. FINDINGS: Linear atelectasis at the left lung base. Left main, lad, and circumflex coronary artery calcification. The gastric body is well opposed to the anterior abdominal wall. Transverse colon resides below the gastric body. Anatomy is favorable for gastrostomy tube placement. Liver is unremarkable Pancreas is nonvisualized Spleen is unremarkable. Adrenal glands and kidneys are grossly within normal limits Pancreas is poorly visualized but is grossly within normal limits. Moderate stool burden throughout the transverse colon. Uterus is not clearly visualized and may have been resected. Unremarkable bladder and sigmoid colon. 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 There is anterolisthesis L4 upon L5. No pars defect. Left side of L5 is sacralized. There are degenerative changes in the hip joints. Atherosclerotic calcifications of the aorta and iliac vasculature are noted. IMPRESSION: Anatomy is favorable for gastrostomy tube  placement No acute intra-abdominal pathology. Moderate stool burden. INDICATION: Chest Electronically Signed   By: Jolaine Click M.D.   On: 06/15/2015 09:29   Dg Chest 2 View  06/12/2015  CLINICAL DATA:  Stroke.  Right-sided deficits and aphasia. EXAM: CHEST  2 VIEW COMPARISON:  05/11/2015 FINDINGS: The heart size and mediastinal contours are within normal limits. The pulmonary arteries appear prominent suggestive of PA hypertension. Both lungs are clear. The visualized skeletal structures are unremarkable. IMPRESSION: 1. No acute findings. 2. Prominent pulmonary arteries suggestive of PA hypertension. Electronically Signed   By: Signa Kell M.D.   On: 06/12/2015 16:34   Ct Head Wo Contrast  06/12/2015  CLINICAL DATA:  Altered mental status EXAM: CT HEAD WITHOUT CONTRAST TECHNIQUE: Contiguous axial images were obtained from the base of the skull through the vertex without intravenous contrast. COMPARISON:  05/12/2015 FINDINGS: The bony calvarium is intact. Atrophic changes and chronic white matter ischemic changes are seen similar to that noted on prior MRI examination. No findings to suggest acute hemorrhage, acute infarction or space-occupying mass lesion are noted. IMPRESSION: No acute abnormality noted. Electronically Signed   By: Alcide Clever M.D.   On: 06/12/2015 16:56   Mr Maxine Glenn Head Wo Contrast  06/13/2015  CLINICAL DATA:  Initial evaluation for progressive right-sided weakness and aphasia with episodes of unresponsiveness. EXAM: MRI HEAD WITHOUT CONTRAST MRA HEAD WITHOUT CONTRAST MRA NECK WITHOUT AND WITH CONTRAST TECHNIQUE: Multiplanar, multiecho pulse sequences of the brain and surrounding structures  were obtained without intravenous contrast. Angiographic images of the Circle of Willis were obtained using MRA technique without intravenous contrast. Angiographic images of the neck were obtained using MRA technique without and with intravenous contrast. Carotid stenosis measurements (when  applicable) are obtained utilizing NASCET criteria, using the distal internal carotid diameter as the denominator. CONTRAST:  10mL MULTIHANCE GADOBENATE DIMEGLUMINE 529 MG/ML IV SOLN COMPARISON:  Previous CT from 06/12/2015 as well as previous brain MRI from 05/12/2015. FINDINGS: MRI HEAD FINDINGS Study somewhat degraded by motion artifact. Diffuse prominence of the CSF containing spaces compatible with generalized cerebral atrophy, fairly advanced in nature. Patchy T2/FLAIR hyperintensity within the periventricular and deep white matter both cerebral hemispheres most likely related chronic small vessel ischemic disease, moderate nature. Chronic small vessel ischemic type changes present within the pons. Cortical/subcortical encephalomalacia within the parasagittal left frontal lobe consistent with remote infarct, relatively small in nature, and stable relative to previous MRI. No other remote infarcts identified. No abnormal foci of restricted diffusion to suggest acute infarct. Gray-white matter differentiation maintained. Major intracranial vascular flow voids are preserved. No acute intracranial hemorrhage. Small focus susceptibility air that within a left superior cerebellar vermis likely reflects a small chronic micro hemorrhage. No mass lesion, midline shift, or mass effect. Ventricular prominence related to global parenchymal volume loss of hydrocephalus. No extra-axial fluid collection. Major dural sinuses are patent. Craniocervical junction within normal limits. Visualized upper cervical spine unremarkable. Incidental note made of a partially empty sella. No acute abnormality about the orbits. Sequela prior lens extraction on the right. Paranasal sinuses are clear. No mastoid effusion. Inner ear structures normal. Bone marrow signal intensity within normal limits. No scalp soft tissue abnormality. MRA HEAD FINDINGS ANTERIOR CIRCULATION: Visualized distal cervical segments of the internal carotid arteries  are patent with antegrade flow. Petrous segments patent. Mild atheromatous irregularity within the cavernous and supraclinoid ICAs without flow-limiting stenosis. ICA termini widely patent. There is a a saccular aneurysm at the level of the right posterior communicating artery measuring approximately 8 x 6 mm (series 4, image 36). A1 segments patent. Anterior communicating artery normal. Anterior cerebral arteries are opacified to their distal aspect without definite flow limiting stenosis. Irregularity within the mid A2 segments favored to be related to motion. Right M1 segment patent without stenosis or occlusion. Right MCA bifurcation normal. Distal small vessel atheromatous irregularity within the right MCA branches. The proximal and mid aspect of the left A1 segment are widely patent. Absent signal void within the distal left M1 segment, favored to be artifactual, as a normal flow void is seen within this region on on the T2 weighted sequence of the corresponding brain. Flow was seen distally within the left MCA branches, but demonstrate multifocal atheromatous irregularity. POSTERIOR CIRCULATION: Vertebral arteries patent to the vertebrobasilar junction. Left vertebral artery slightly dominant. Left posterior inferior cerebral artery patent. Right posterior inferior cerebral artery not well visualized. Basilar artery widely patent. Superior cerebellar arteries patent proximally. Left PCA arises from the basilar artery and is well opacified to its distal aspect. Fetal type right PCA supplied via the right posterior communicating artery. Right PCA is attenuated distally, and may be partially occluded. MRA NECK FINDINGS Visualized aortic arch is of normal caliber with normal branch pattern. No high-grade stenosis at the origin of the great vessels. Mild atheromatous narrowing of the proximal left subclavian artery without significant stenosis. Right common carotid artery patent from its origin to the bifurcation.  No significant atheromatous narrowing about the right bifurcation. Right ICA widely  patent from the bifurcation to the skullbase. No flow limiting stenosis, dissection, or vascular occlusion within the right carotid artery system. Left common carotid artery widely patent from its origin to the bifurcation. No significant stenosis about the left bifurcation. Left ICA widely patent from the bifurcation to the skullbase. No flow limiting stenosis, dissection, or vascular occlusion within the left carotid artery system. Vertebral arteries both arise from the subclavian arteries. Origin of the vertebral arteries not well seen on this exam. Vertebral arteries widely patent within the neck without stenosis, occlusion, or definite dissection. Left vertebral artery is dominant. IMPRESSION: MRI HEAD IMPRESSION: 1. No acute intracranial infarct or other process identified. 2. Age advanced cerebral atrophy with moderate chronic small vessel ischemic disease. 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 These findings are stable. MRA HEAD IMPRESSION: 1. No large or proximal arterial branch occlusion within the intracranial circulation. No high-grade or correctable stenosis. 2. Attenuation of the distal right PCA, which may be occluded. 3. 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 There is a fetal type right PCA. 4. Distal small vessel irregularity within the intracranial circulation. MRA NECK IMPRESSION: Negative MRA of the neck. No flow limiting or critical stenosis identified. Electronically Signed   By: Rise Mu M.D.   On: 06/13/2015 05:53   Mr Angiogram Neck W Wo Contrast  06/13/2015  CLINICAL DATA:  Initial evaluation for progressive right-sided weakness and aphasia with episodes of unresponsiveness. EXAM: MRI HEAD WITHOUT CONTRAST MRA HEAD WITHOUT CONTRAST MRA NECK WITHOUT AND WITH CONTRAST TECHNIQUE: Multiplanar, multiecho pulse sequences of the brain and surrounding structures  were obtained without intravenous contrast. Angiographic images of the Circle of Willis were obtained using MRA technique without intravenous contrast. Angiographic images of the neck were obtained using MRA technique without and with intravenous contrast. Carotid stenosis measurements (when applicable) are obtained utilizing NASCET criteria, using the distal internal carotid diameter as the denominator. CONTRAST:  10mL MULTIHANCE GADOBENATE DIMEGLUMINE 529 MG/ML IV SOLN COMPARISON:  Previous CT from 06/12/2015 as well as previous brain MRI from 05/12/2015. FINDINGS: MRI HEAD FINDINGS Study somewhat degraded by motion artifact. Diffuse prominence of the CSF containing spaces compatible with generalized cerebral atrophy, fairly advanced in nature. Patchy T2/FLAIR hyperintensity within the periventricular and deep white matter both cerebral hemispheres most likely related chronic small vessel ischemic disease, moderate nature. Chronic small vessel ischemic type changes present within the pons. Cortical/subcortical encephalomalacia within the parasagittal left frontal lobe consistent with remote infarct, relatively small in nature, and stable relative to previous MRI. No other remote infarcts identified. No abnormal foci of restricted diffusion to suggest acute infarct. Gray-white matter differentiation maintained. Major intracranial vascular flow voids are preserved. No acute intracranial hemorrhage. Small focus susceptibility air that within a left superior cerebellar vermis likely reflects a small chronic micro hemorrhage. No mass lesion, midline shift, or mass effect. Ventricular prominence related to global parenchymal volume loss of hydrocephalus. No extra-axial fluid collection. Major dural sinuses are patent. Craniocervical junction within normal limits. Visualized upper cervical spine unremarkable. Incidental note made of a partially empty sella. No acute abnormality about the orbits. Sequela prior lens  extraction on the right. Paranasal sinuses are clear. No mastoid effusion. Inner ear structures normal. Bone marrow signal intensity within normal limits. No scalp soft tissue abnormality. MRA HEAD FINDINGS ANTERIOR CIRCULATION: Visualized distal cervical segments of the internal carotid arteries are patent with antegrade flow. Petrous segments patent. Mild atheromatous irregularity within the cavernous and supraclinoid ICAs without flow-limiting stenosis. ICA  termini widely patent. There is a a saccular aneurysm at the level of the right posterior communicating artery measuring approximately 8 x 6 mm (series 4, image 36). A1 segments patent. Anterior communicating artery normal. Anterior cerebral arteries are opacified to their distal aspect without definite flow limiting stenosis. Irregularity within the mid A2 segments favored to be related to motion. Right M1 segment patent without stenosis or occlusion. Right MCA bifurcation normal. Distal small vessel atheromatous irregularity within the right MCA branches. The proximal and mid aspect of the left A1 segment are widely patent. Absent signal void within the distal left M1 segment, favored to be artifactual, as a normal flow void is seen within this region on on the T2 weighted sequence of the corresponding brain. Flow was seen distally within the left MCA branches, but demonstrate multifocal atheromatous irregularity. POSTERIOR CIRCULATION: Vertebral arteries patent to the vertebrobasilar junction. Left vertebral artery slightly dominant. Left posterior inferior cerebral artery patent. Right posterior inferior cerebral artery not well visualized. Basilar artery widely patent. Superior cerebellar arteries patent proximally. Left PCA arises from the basilar artery and is well opacified to its distal aspect. Fetal type right PCA supplied via the right posterior communicating artery. Right PCA is attenuated distally, and may be partially occluded. MRA NECK FINDINGS  Visualized aortic arch is of normal caliber with normal branch pattern. No high-grade stenosis at the origin of the great vessels. Mild atheromatous narrowing of the proximal left subclavian artery without significant stenosis. Right common carotid artery patent from its origin to the bifurcation. No significant atheromatous narrowing about the right bifurcation. Right ICA widely patent from the bifurcation to the skullbase. No flow limiting stenosis, dissection, or vascular occlusion within the right carotid artery system. Left common carotid artery widely patent from its origin to the bifurcation. No significant stenosis about the left bifurcation. Left ICA widely patent from the bifurcation to the skullbase. No flow limiting stenosis, dissection, or vascular occlusion within the left carotid artery system. Vertebral arteries both arise from the subclavian arteries. Origin of the vertebral arteries not well seen on this exam. Vertebral arteries widely patent within the neck without stenosis, occlusion, or definite dissection. Left vertebral artery is dominant. IMPRESSION: MRI HEAD IMPRESSION: 1. No acute intracranial infarct or other process identified. 2. Age advanced cerebral atrophy with moderate chronic small vessel ischemic disease. 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 These findings are stable. MRA HEAD IMPRESSION: 1. No large or proximal arterial branch occlusion within the intracranial circulation. No high-grade or correctable stenosis. 2. Attenuation of the distal right PCA, which may be occluded. 3. 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 There is a fetal type right PCA. 4. Distal small vessel irregularity within the intracranial circulation. MRA NECK IMPRESSION: Negative MRA of the neck. No flow limiting or critical stenosis identified. Electronically Signed   By: Rise MuBenjamin  McClintock M.D.   On: 06/13/2015 05:53   Mr Brain Wo Contrast  06/13/2015  CLINICAL DATA:   Initial evaluation for progressive right-sided weakness and aphasia with episodes of unresponsiveness. EXAM: MRI HEAD WITHOUT CONTRAST MRA HEAD WITHOUT CONTRAST MRA NECK WITHOUT AND WITH CONTRAST TECHNIQUE: Multiplanar, multiecho pulse sequences of the brain and surrounding structures were obtained without intravenous contrast. Angiographic images of the Circle of Willis were obtained using MRA technique without intravenous contrast. Angiographic images of the neck were obtained using MRA technique without and with intravenous contrast. Carotid stenosis measurements (when applicable) are obtained utilizing NASCET criteria, using the distal internal carotid diameter  as the denominator. CONTRAST:  10mL MULTIHANCE GADOBENATE DIMEGLUMINE 529 MG/ML IV SOLN COMPARISON:  Previous CT from 06/12/2015 as well as previous brain MRI from 05/12/2015. FINDINGS: MRI HEAD FINDINGS Study somewhat degraded by motion artifact. Diffuse prominence of the CSF containing spaces compatible with generalized cerebral atrophy, fairly advanced in nature. Patchy T2/FLAIR hyperintensity within the periventricular and deep white matter both cerebral hemispheres most likely related chronic small vessel ischemic disease, moderate nature. Chronic small vessel ischemic type changes present within the pons. Cortical/subcortical encephalomalacia within the parasagittal left frontal lobe consistent with remote infarct, relatively small in nature, and stable relative to previous MRI. No other remote infarcts identified. No abnormal foci of restricted diffusion to suggest acute infarct. Gray-white matter differentiation maintained. Major intracranial vascular flow voids are preserved. No acute intracranial hemorrhage. Small focus susceptibility air that within a left superior cerebellar vermis likely reflects a small chronic micro hemorrhage. No mass lesion, midline shift, or mass effect. Ventricular prominence related to global parenchymal volume loss of  hydrocephalus. No extra-axial fluid collection. Major dural sinuses are patent. Craniocervical junction within normal limits. Visualized upper cervical spine unremarkable. Incidental note made of a partially empty sella. No acute abnormality about the orbits. Sequela prior lens extraction on the right. Paranasal sinuses are clear. No mastoid effusion. Inner ear structures normal. Bone marrow signal intensity within normal limits. No scalp soft tissue abnormality. MRA HEAD FINDINGS ANTERIOR CIRCULATION: Visualized distal cervical segments of the internal carotid arteries are patent with antegrade flow. Petrous segments patent. Mild atheromatous irregularity within the cavernous and supraclinoid ICAs without flow-limiting stenosis. ICA termini widely patent. There is a a saccular aneurysm at the level of the right posterior communicating artery measuring approximately 8 x 6 mm (series 4, image 36). A1 segments patent. Anterior communicating artery normal. Anterior cerebral arteries are opacified to their distal aspect without definite flow limiting stenosis. Irregularity within the mid A2 segments favored to be related to motion. Right M1 segment patent without stenosis or occlusion. Right MCA bifurcation normal. Distal small vessel atheromatous irregularity within the right MCA branches. The proximal and mid aspect of the left A1 segment are widely patent. Absent signal void within the distal left M1 segment, favored to be artifactual, as a normal flow void is seen within this region on on the T2 weighted sequence of the corresponding brain. Flow was seen distally within the left MCA branches, but demonstrate multifocal atheromatous irregularity. POSTERIOR CIRCULATION: Vertebral arteries patent to the vertebrobasilar junction. Left vertebral artery slightly dominant. Left posterior inferior cerebral artery patent. Right posterior inferior cerebral artery not well visualized. Basilar artery widely patent. Superior  cerebellar arteries patent proximally. Left PCA arises from the basilar artery and is well opacified to its distal aspect. Fetal type right PCA supplied via the right posterior communicating artery. Right PCA is attenuated distally, and may be partially occluded. MRA NECK FINDINGS Visualized aortic arch is of normal caliber with normal branch pattern. No high-grade stenosis at the origin of the great vessels. Mild atheromatous narrowing of the proximal left subclavian artery without significant stenosis. Right common carotid artery patent from its origin to the bifurcation. No significant atheromatous narrowing about the right bifurcation. Right ICA widely patent from the bifurcation to the skullbase. No flow limiting stenosis, dissection, or vascular occlusion within the right carotid artery system. Left common carotid artery widely patent from its origin to the bifurcation. No significant stenosis about the left bifurcation. Left ICA widely patent from the bifurcation to the skullbase. No flow  limiting stenosis, dissection, or vascular occlusion within the left carotid artery system. Vertebral arteries both arise from the subclavian arteries. Origin of the vertebral arteries not well seen on this exam. Vertebral arteries widely patent within the neck without stenosis, occlusion, or definite dissection. Left vertebral artery is dominant. IMPRESSION: MRI HEAD IMPRESSION: 1. No acute intracranial infarct or other process identified. 2. Age advanced cerebral atrophy with moderate chronic small vessel ischemic disease. 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 These findings are stable. MRA HEAD IMPRESSION: 1. No large or proximal arterial branch occlusion within the intracranial circulation. No high-grade or correctable stenosis. 2. Attenuation of the distal right PCA, which may be occluded. 3. 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 There is a fetal type right PCA. 4. Distal small  vessel irregularity within the intracranial circulation. MRA NECK IMPRESSION: Negative MRA of the neck. No flow limiting or critical stenosis identified. Electronically Signed   By: Rise Mu M.D.   On: 06/13/2015 05:53   Ir Gastrostomy Tube Mod Sed  06/15/2015  INDICATION: Stroke EXAM: PERC PLACEMENT GASTROSTOMY MEDICATIONS: ; Antibiotics were administered within 1 hour of the procedure. Glucagon 1 mg IV ANESTHESIA/SEDATION: Versed 1 mg IV; Fentanyl 75 mcg IV Moderate Sedation Time:  10 The patient was continuously monitored during the procedure by the interventional radiology nurse under my direct supervision. CONTRAST:  10 cc Omnipaque 300 - administered into the gastric lumen. FLUOROSCOPY TIME:  Fluoroscopy Time: 6 minutes 0 seconds (8 mGy). COMPLICATIONS: None immediate. PROCEDURE: The procedure, risks, benefits, and alternatives were explained to the patient. Questions regarding the procedure were encouraged and answered. The patient understands and consents to the procedure. The epigastrium was prepped with Betadine in a sterile fashion, and a sterile drape was applied covering the operative field. A sterile gown and sterile gloves were used for the procedure. A 5-French orogastric tube is placed under fluoroscopic guidance. Scout imaging of the abdomen confirms barium within the transverse colon. The stomach was distended with gas. Under fluoroscopic guidance, an 18 gauge needle was utilized to puncture the anterior wall of the body of the stomach. An Amplatz wire was advanced through the needle passing a T fastener into the lumen of the stomach. The T fastener was secured for gastropexy. A 9-French sheath was inserted. A snare was advanced through the 9-French sheath. A Teena Dunk was advanced through the orogastric tube. It was snared then pulled out the oral cavity, pulling the snare, as well. The leading edge of the gastrostomy was attached to the snare. It was then pulled down the esophagus  and out the percutaneous site. It was secured in place. Contrast was injected. The image demonstrates placement of a 20-French pull-through type gastrostomy tube into the body of the stomach. IMPRESSION: Successful 20 French pull-through gastrostomy. Electronically Signed   By: Jolaine Click M.D.   On: 06/15/2015 14:36   Ir US Guide Vasc Access Right  06/15/2015  INDICATION: Poor IV access EXAM: RIGHT UPPER EXTREMITY PICC LINE PLACEMENT WITH ULTRASOUND AND FLUOROSCOPIC GUIDANCE MEDICATIONS: ; The antibiotic was administered within an appropriate time interval prior to skin puncture. ANESTHESIA/SEDATION: None FLUOROSCOPY TIME:  Fluoroscopy Time:  minutes 12 seconds ( mGy). COMPLICATIONS: None immediate. PROCEDURE: The patient was advised of the possible risks and complications and agreed to undergo the procedure. The patient was then brought to the angiographic suite for the procedure. The right arm was prepped with chlorhexidine, draped in the usual sterile fashion using maximum barrier technique (cap and  mask, sterile gown, sterile gloves, large sterile sheet, hand hygiene and cutaneous antisepsis) and infiltrated locally with 1% Lidocaine. Ultrasound demonstrated patency of the right basilar vein, and this was documented with an image. Under real-time ultrasound guidance, this vein was accessed with a 21 gauge micropuncture needle and image documentation was performed. A 0.018 wire was introduced in to the vein. Over this, a 5 [place] double lumen power PICC was advanced to the lower SVC/right atrial junction. Fluoroscopy during the procedure and fluoro spot radiograph confirms appropriate catheter position. The catheter was flushed and covered with asterile dressing. Catheter length: 44 cm IMPRESSION: Successful right arm power PICC line placement with ultrasound and fluoroscopic guidance. The catheter is ready for use. Electronically Signed   By: Jolaine Click M.D.   On: 06/15/2015 14:36   Ir Fluoro Guide Cv  Midline Picc Right  06/15/2015  INDICATION: Poor IV access EXAM: RIGHT UPPER EXTREMITY PICC LINE PLACEMENT WITH ULTRASOUND AND FLUOROSCOPIC GUIDANCE MEDICATIONS: ; The antibiotic was administered within an appropriate time interval prior to skin puncture. ANESTHESIA/SEDATION: None FLUOROSCOPY TIME:  Fluoroscopy Time:  minutes 12 seconds ( mGy). COMPLICATIONS: None immediate. PROCEDURE: The patient was advised of the possible risks and complications and agreed to undergo the procedure. The patient was then brought to the angiographic suite for the procedure. The right arm was prepped with chlorhexidine, draped in the usual sterile fashion using maximum barrier technique (cap and mask, sterile gown, sterile gloves, large sterile sheet, hand hygiene and cutaneous antisepsis) and infiltrated locally with 1% Lidocaine. Ultrasound demonstrated patency of the right basilar vein, and this was documented with an image. Under real-time ultrasound guidance, this vein was accessed with a 21 gauge micropuncture needle and image documentation was performed. A 0.018 wire was introduced in to the vein. Over this, a 5 [place] double lumen power PICC was advanced to the lower SVC/right atrial junction. Fluoroscopy during the procedure and fluoro spot radiograph confirms appropriate catheter position. The catheter was flushed and covered with asterile dressing. Catheter length: 44 cm IMPRESSION: Successful right arm power PICC line placement with ultrasound and fluoroscopic guidance. The catheter is ready for use. Electronically Signed   By: Jolaine Click M.D.   On: 06/15/2015 14:36     CBC  Recent Labs Lab 06/12/15 1600 06/13/15 0518 06/15/15 0334 06/16/15 0455  WBC 6.3 4.7 6.7 7.0  HGB 12.6 11.9* 10.4* 10.1*  HCT 38.9 36.0 31.8* 30.8*  PLT 222 203 222 205  MCV 92.0 91.8 90.1 90.6  MCH 29.8 30.4 29.5 29.7  MCHC 32.4 33.1 32.7 32.8  RDW 13.7 13.9 13.4 13.4  LYMPHSABS 2.3  --   --   --   MONOABS 0.5  --   --   --    EOSABS 0.1  --   --   --   BASOSABS 0.0  --   --   --     Chemistries   Recent Labs Lab 06/12/15 1600 06/13/15 0518 06/14/15 0231 06/14/15 0356 06/15/15 0334 06/15/15 0949 06/16/15 0455  NA 144 145 145 147* 142  --  143  K 3.8 4.0 4.3 3.5 3.2*  --  3.2*  CL 112* 115* 119* 119* 115*  --  116*  CO2 21* 17* 16* 18* 17*  --  19*  GLUCOSE 115* 78 66 67 103*  --  153*  BUN 21* 18 16 16 10   --  7  CREATININE 0.88 0.62 0.67 0.63 0.56  --  0.60  CALCIUM 9.3 8.6*  8.5* 8.5* 8.6*  --  8.5*  MG  --   --   --   --   --  1.6*  --   AST 18  --  18 13*  --   --  12*  ALT 17  --  11* 11*  --   --  9*  ALKPHOS 56  --  45 43  --   --  46  BILITOT 0.7  --  0.7 0.6  --   --  0.7   ------------------------------------------------------------------------------------------------------------------ estimated creatinine clearance is 46.1 mL/min (by C-G formula based on Cr of 0.6). ------------------------------------------------------------------------------------------------------------------ No results for input(s): HGBA1C in the last 72 hours. ------------------------------------------------------------------------------------------------------------------ No results for input(s): CHOL, HDL, LDLCALC, TRIG, CHOLHDL, LDLDIRECT in the last 72 hours. ------------------------------------------------------------------------------------------------------------------ No results for input(s): TSH, T4TOTAL, T3FREE, THYROIDAB in the last 72 hours.  Invalid input(s): FREET3 ------------------------------------------------------------------------------------------------------------------ No results for input(s): VITAMINB12, FOLATE, FERRITIN, TIBC, IRON, RETICCTPCT in the last 72 hours.  Coagulation profile  Recent Labs Lab 06/12/15 2000  INR 1.32    No results for input(s): DDIMER in the last 72 hours.  Cardiac Enzymes  Recent Labs Lab 06/12/15 1735 06/12/15 2340 06/13/15 0518  TROPONINI 0.29*  0.23* 0.14*   ------------------------------------------------------------------------------------------------------------------ Invalid input(s): POCBNP   CBG:  Recent Labs Lab 06/16/15 0023 06/16/15 0623 06/16/15 2138 06/17/15 0145 06/17/15 0505  GLUCAP 101* 138* 147* 114* 113*       Studies: Ir Gastrostomy Tube Mod Sed  06/15/2015  INDICATION: Stroke EXAM: PERC PLACEMENT GASTROSTOMY MEDICATIONS: ; Antibiotics were administered within 1 hour of the procedure. Glucagon 1 mg IV ANESTHESIA/SEDATION: Versed 1 mg IV; Fentanyl 75 mcg IV Moderate Sedation Time:  10 The patient was continuously monitored during the procedure by the interventional radiology nurse under my direct supervision. CONTRAST:  10 cc Omnipaque 300 - administered into the gastric lumen. FLUOROSCOPY TIME:  Fluoroscopy Time: 6 minutes 0 seconds (8 mGy). COMPLICATIONS: None immediate. PROCEDURE: The procedure, risks, benefits, and alternatives were explained to the patient. Questions regarding the procedure were encouraged and answered. The patient understands and consents to the procedure. The epigastrium was prepped with Betadine in a sterile fashion, and a sterile drape was applied covering the operative field. A sterile gown and sterile gloves were used for the procedure. A 5-French orogastric tube is placed under fluoroscopic guidance. Scout imaging of the abdomen confirms barium within the transverse colon. The stomach was distended with gas. Under fluoroscopic guidance, an 18 gauge needle was utilized to puncture the anterior wall of the body of the stomach. An Amplatz wire was advanced through the needle passing a T fastener into the lumen of the stomach. The T fastener was secured for gastropexy. A 9-French sheath was inserted. A snare was advanced through the 9-French sheath. A Teena Dunk was advanced through the orogastric tube. It was snared then pulled out the oral cavity, pulling the snare, as well. The leading edge of  the gastrostomy was attached to the snare. It was then pulled down the esophagus and out the percutaneous site. It was secured in place. Contrast was injected. The image demonstrates placement of a 20-French pull-through type gastrostomy tube into the body of the stomach. IMPRESSION: Successful 20 French pull-through gastrostomy. Electronically Signed   By: Jolaine Click M.D.   On: 06/15/2015 14:36   Ir US Guide Vasc Access Right  06/15/2015  INDICATION: Poor IV access EXAM: RIGHT UPPER EXTREMITY PICC LINE PLACEMENT WITH ULTRASOUND AND FLUOROSCOPIC GUIDANCE MEDICATIONS: ; The antibiotic was administered  within an appropriate time interval prior to skin puncture. ANESTHESIA/SEDATION: None FLUOROSCOPY TIME:  Fluoroscopy Time:  minutes 12 seconds ( mGy). COMPLICATIONS: None immediate. PROCEDURE: The patient was advised of the possible risks and complications and agreed to undergo the procedure. The patient was then brought to the angiographic suite for the procedure. The right arm was prepped with chlorhexidine, draped in the usual sterile fashion using maximum barrier technique (cap and mask, sterile gown, sterile gloves, large sterile sheet, hand hygiene and cutaneous antisepsis) and infiltrated locally with 1% Lidocaine. Ultrasound demonstrated patency of the right basilar vein, and this was documented with an image. Under real-time ultrasound guidance, this vein was accessed with a 21 gauge micropuncture needle and image documentation was performed. A 0.018 wire was introduced in to the vein. Over this, a 5 [place] double lumen power PICC was advanced to the lower SVC/right atrial junction. Fluoroscopy during the procedure and fluoro spot radiograph confirms appropriate catheter position. The catheter was flushed and covered with asterile dressing. Catheter length: 44 cm IMPRESSION: Successful right arm power PICC line placement with ultrasound and fluoroscopic guidance. The catheter is ready for use.  Electronically Signed   By: Jolaine Click M.D.   On: 06/15/2015 14:36   Ir Fluoro Guide Cv Midline Picc Right  06/15/2015  INDICATION: Poor IV access EXAM: RIGHT UPPER EXTREMITY PICC LINE PLACEMENT WITH ULTRASOUND AND FLUOROSCOPIC GUIDANCE MEDICATIONS: ; The antibiotic was administered within an appropriate time interval prior to skin puncture. ANESTHESIA/SEDATION: None FLUOROSCOPY TIME:  Fluoroscopy Time:  minutes 12 seconds ( mGy). COMPLICATIONS: None immediate. PROCEDURE: The patient was advised of the possible risks and complications and agreed to undergo the procedure. The patient was then brought to the angiographic suite for the procedure. The right arm was prepped with chlorhexidine, draped in the usual sterile fashion using maximum barrier technique (cap and mask, sterile gown, sterile gloves, large sterile sheet, hand hygiene and cutaneous antisepsis) and infiltrated locally with 1% Lidocaine. Ultrasound demonstrated patency of the right basilar vein, and this was documented with an image. Under real-time ultrasound guidance, this vein was accessed with a 21 gauge micropuncture needle and image documentation was performed. A 0.018 wire was introduced in to the vein. Over this, a 5 [place] double lumen power PICC was advanced to the lower SVC/right atrial junction. Fluoroscopy during the procedure and fluoro spot radiograph confirms appropriate catheter position. The catheter was flushed and covered with asterile dressing. Catheter length: 44 cm IMPRESSION: Successful right arm power PICC line placement with ultrasound and fluoroscopic guidance. The catheter is ready for use. Electronically Signed   By: Jolaine Click M.D.   On: 06/15/2015 14:36      No results found for: HGBA1C Lab Results  Component Value Date   CREATININE 0.60 06/16/2015       Scheduled Meds: . aspirin  325 mg Oral Daily  . baclofen  5 mg Oral QHS  . heparin  5,000 Units Subcutaneous Q8H  . levETIRAcetam  750 mg  Intravenous Q12H  . metoprolol tartrate  12.5 mg Per Tube BID  . pravastatin  10 mg Oral q1800  . QUEtiapine  25 mg Oral QHS  . sodium chloride flush  3 mL Intravenous Q12H   Continuous Infusions: . feeding supplement (JEVITY 1.2 CAL) 1,000 mL (06/16/15 2233)     LOS: 4 days    Time spent: >30 MINS    [facility]  [facility] Pager [phone]. If 7PM-7AM, please contact night-coverage at [email or web address], password [facility] 06/17/2015, 9:45  AM  LOS: 4 days

## 2015-06-17 NOTE — Progress Notes (Signed)
VASCULAR LAB PRELIMINARY  PRELIMINARY  PRELIMINARY  PRELIMINARY  Bilateral upper extremity venous duplex completed.    Preliminary report:  There is no obvious evidence of DVT noted in the bilateral upper extremities.  There is a partially occlusive superficial thrombosis noted in the right basilic vein.   Called report to [place]wood[place], RN  [facility], [facility], RVT 06/17/2015, 8:47 AM

## 2015-06-18 LAB — GLUCOSE, CAPILLARY
GLUCOSE-CAPILLARY: 86 mg/dL (ref 65–99)
GLUCOSE-CAPILLARY: 101 mg/dL — AB (ref 65–99)
GLUCOSE-CAPILLARY: 122 mg/dL — AB (ref 65–99)
GLUCOSE-CAPILLARY: 110 mg/dL — AB (ref 65–99)

## 2015-06-18 MED ORDER — QUETIAPINE FUMARATE 25 MG PO TABS: 25.0000 mg | ORAL_TABLET | Freq: Every day | ORAL | Status: AC

## 2015-06-18 MED ORDER — MAGNESIUM OXIDE 400 (241.3 MG) MG PO TABS: 400.0000 mg | ORAL_TABLET | Freq: Two times a day (BID) | ORAL | Status: AC

## 2015-06-18 MED ORDER — POTASSIUM CHLORIDE 20 MEQ/15ML (10%) PO SOLN
40.0000 meq | Freq: Every day | ORAL | Status: DC
  Administered 2015-06-18: 40 meq via ORAL
  Filled 2015-06-18: qty 30

## 2015-06-18 MED ORDER — LEVETIRACETAM 750 MG PO TABS: 750.0000 mg | ORAL_TABLET | Freq: Two times a day (BID) | ORAL | Status: AC

## 2015-06-18 MED ORDER — MAGNESIUM OXIDE 400 (241.3 MG) MG PO TABS
400.0000 mg | ORAL_TABLET | Freq: Two times a day (BID) | ORAL | Status: DC
  Administered 2015-06-18: 400 mg via ORAL
  Filled 2015-06-18: qty 1

## 2015-06-18 MED ORDER — POTASSIUM CHLORIDE 20 MEQ/15ML (10%) PO SOLN: 40.0000 meq | Freq: Every day | ORAL | Status: AC

## 2015-06-18 MED ORDER — METOPROLOL TARTRATE 25 MG PO TABS: 12.5000 mg | ORAL_TABLET | Freq: Two times a day (BID) | ORAL | Status: AC

## 2015-06-18 MED ORDER — HYDROCODONE-ACETAMINOPHEN 5-325 MG PO TABS: 1.0000 | ORAL_TABLET | ORAL | Status: AC | PRN

## 2015-06-18 MED ORDER — JEVITY 1.2 CAL PO LIQD: 1000.0000 mL | ORAL | Status: AC

## 2015-06-18 MED ORDER — JEVITY 1.2 CAL PO LIQD: 1000.0000 mL | ORAL | Status: DC

## 2015-06-18 NOTE — Care Management Note (Signed)
Case Management Note Donn PieriniKristi Makael Stein RN, BSN Unit 2W-Case Manager [phone]  Patient Details  Name: Carrie SaleDorita Veltri MRN: [id number] Date of Birth: 1932-03-16  Subjective/Objective:     Pt admitted with acute encephalopathy (hx CVA)               Action/Plan: PTA pt lived at home with spouse- [facility]- RN/PT/OT/ST/CSW-  PC consult has been ordered for GOC and is pending- per conversation with spouse at bedside- they have needed DME at home- lift was just delivered to home prior to admit and someone is to come out and show them how to use it- husband also states a hospital bed is on order but has yet to be delivered- [facility]- spouse plans on returning home with pt. CM to follow for d/c plans and recommendations after PC mtg.   Expected Discharge Date:   06/18/15               Expected Discharge Plan:  Home w Home Health Services  In-House Referral:     Discharge planning Services  CM Consult  Post Acute Care Choice:  Home Health, Resumption of Svcs/PTA Provider Choice offered to:  Spouse  DME Arranged:  Tube feeding pump, Tube feeding, Hospital bed DME Agency:  [facility]  HH Arranged:  RN, PT, OT, Speech Therapy, Social Work [employer] Agency:  [facility]  Status of Service:  Completed, signed off  Medicare Important Message Given:  Yes Date Medicare IM Given:    Medicare IM give by:    Date Additional Medicare IM Given:    Additional Medicare Important Message give by:     If discussed at Long Length of Stay Meetings, dates discussed:    Additional Comments: 1st Contact- Glenda [place]nevale- (granddaughter)- [phone] 2nd Contact- Vira BrownsKim Butler- (daughter)- [phone]   06/18/15- 1400- Donn PieriniKristi Brennan Litzinger RN, BSN- [facility]- spoke with spouse at bedside and confirmed [facility]- confirmed that pt would transport home via ambulance transport-  hospital bed being set up at home at this time. Will arrange home tube feedings- [facility] feeds and DME needs- call made to Darl PikesSusan [facility].   06/14/15- 1500- Donn PieriniKristi Janiah Devinney RN BSN- per PC - Yong ChannelAlicia Parker NP- pt's [facility]- they understand that insurance will not pay for transport due to this request coming from family and not a medical necessity - have spoken with pt's daughter Vira BrownsKim Butler [phone])- MD paged regarding this transfer request- [facility] (referral line to call [phone])- [employer] form has been placed on shadow chart for  MD signature. 1600- spoke with Dr. Susie CassetteAbrol- who will [facility] to see if there is an MD that will accept pt in tx- have explained to family that the level of care is no different here than the level of [facility] be provided [facility] and there may not be an accepting physician willing to take pt in tx. - Have updated pt's daughter Vira BrownsKim Butler and asked her for a primary contact person- she states to call her daughter Legrand PittsGlenda Yi (pt's granddaughter) as primary contact [phone] and Selena BattenKim will be the secondary contact if Rivka BarbaraGlenda can not be reached with any updates regarding [facility]- CM  to continue to follow  Darrold Span, RN 06/18/2015, 10:38 PM

## 2015-06-18 NOTE — Progress Notes (Signed)
[facility] called for transport home- bedside RN aware

## 2015-06-18 NOTE — Progress Notes (Signed)
Patient d/c home with husband, advanced home care set up for patient [facility] information [facility]. Discharge instructions, RX's and follow appts explained and provided to husband verbalized understanding. Patient transported home by [facility] services left floor via stretcher. picc line removed by IV team, gtube clamped.  Caniya Tagle, Kae HellerMiranda Lynn, RN

## 2015-06-18 NOTE — Clinical Social Work Note (Signed)
CSW received consult. CSW met with patient's husband. Patient's husband reported he is aware of  all the options available to be able to effectively care for the patient. Patient's husband stated he wants to take patient home when patient is medically stable and ready for discharge. CSW signing off.   [employer], LCSW [phone]

## 2015-06-18 NOTE — Discharge Summary (Addendum)
Physician Discharge Summary  Akina Maish MRN: [id number] DOB/AGE: 09/04/1932 82 y.o.  PCP: No PCP Per Patient   Admit date: 06/12/2015 Discharge date: 06/18/2015  Discharge Diagnoses:     Principal Problem:   Acute encephalopathy Active Problems:   Altered mental status   Stroke [facility])   Hyperlipidemia   Depression   Essential hypertension   Aphasia   Encephalopathy   Palliative care encounter   Cerebrovascular accident (CVA) due to thrombosis of precerebral artery (HCC)    Follow-up recommendations Follow-up with PCP in 3-5 days , including all  additional recommended appointments as below Follow-up CBC, CMP in 3-5 days Patient initiated on tube feeding this admission, being discharged with home health, Hospital bed,  Dressing procedure/placement/frequency: Cleanse sacral wound with normal saline. Pat dry. Cover with 5 x 5 Allevyn foam dressing. Change every five days and prn if soiled. Low air loss mattress for pressure relief Bilateral Prevalon boots while in bed for pressure relief to heels  Family refused SNF     Current Discharge Medication List    START taking these medications   Details  magnesium oxide (MAG-OX) 400 (241.3 Mg) MG tablet Take 1 tablet (400 mg total) by mouth 2 (two) times daily. Qty: 60 tablet, Refills: 1    metoprolol tartrate (LOPRESSOR) 25 MG tablet Place 0.5 tablets (12.5 mg total) into feeding tube 2 (two) times daily. Qty: 60 tablet, Refills: 1    Nutritional Supplements (FEEDING SUPPLEMENT, JEVITY 1.2 CAL,) LIQD Place 1,000 mLs into feeding tube continuous. Qty: 1500 mL, Refills: 0    potassium chloride 20 MEQ/15ML (10%) SOLN Take 30 mLs (40 mEq total) by mouth daily. Qty: 450 mL, Refills: 0    QUEtiapine (SEROQUEL) 25 MG tablet Take 1 tablet (25 mg total) by mouth at bedtime. Qty: 30 tablet, Refills: 1      CONTINUE these medications which have CHANGED   Details  HYDROcodone-acetaminophen (NORCO/VICODIN) 5-325 MG tablet Take 1  tablet by mouth every 4 (four) hours as needed for moderate pain. Qty: 30 tablet, Refills: 0    levETIRAcetam (KEPPRA) 750 MG tablet Take 1 tablet (750 mg total) by mouth 2 (two) times daily. Qty: 60 tablet, Refills: 1      CONTINUE these medications which have NOT CHANGED   Details  baclofen (LIORESAL) 10 MG tablet Take 5 mg by mouth at bedtime. For 30 days ending 06-05-15    [facility] ASPIRIN 325 MG tablet Take 325 mg by mouth daily. Refills: 5    docusate sodium (COLACE) 100 MG capsule Take 100 mg by mouth 2 (two) times daily.    Multiple Vitamin (MULTIVITAMIN) tablet Take 1 tablet by mouth daily.    pravastatin (PRAVACHOL) 10 MG tablet Take 10 mg by mouth daily at 6 PM. Refills: 5    traZODone (DESYREL) 50 MG tablet Take 50 mg by mouth at bedtime.      STOP taking these medications     gabapentin (NEURONTIN) 100 MG capsule      lisinopril (PRINIVIL,ZESTRIL) 5 MG tablet      megestrol (MEGACE) 40 MG tablet      azithromycin (ZITHROMAX) 250 MG tablet      celecoxib (CELEBREX) 100 MG capsule      HYDROcodone-homatropine (HYCODAN) 5-1.5 MG/5ML syrup      sertraline (ZOLOFT) 25 MG tablet          Discharge Condition: Overall prognosis is extremely poor   Discharge Instructions Get Medicines reviewed and adjusted: Please take all your medications with  you for your next visit with your Primary MD  Please request your Primary MD to go over all hospital tests and procedure/radiological results at the follow up, please ask your Primary MD to get all Hospital records sent to his/her office.  If you experience worsening of your admission symptoms, develop shortness of breath, life threatening emergency, suicidal or homicidal thoughts you must seek medical attention immediately by calling 911 or calling your MD immediately if symptoms less severe.  You must read complete instructions/literature along with all the possible adverse reactions/side effects for all the Medicines  you take and that have been prescribed to you. Take any new Medicines after you have completely understood and accpet all the possible adverse reactions/side effects.   Do not drive when taking Pain medications.   Do not take more than prescribed Pain, Sleep and Anxiety Medications  Special Instructions: If you have smoked or chewed Tobacco in the last 2 yrs please stop smoking, stop any regular Alcohol and or any Recreational drug use.  Wear Seat belts while driving.  Please note  You were cared for by a hospitalist during your hospital stay. Once you are discharged, your primary care physician will handle any further medical issues. Please note that NO REFILLS for any discharge medications will be authorized once you are discharged, as it is imperative that you return to your primary care physician (or establish a relationship with a primary care physician if you do not have one) for your aftercare needs so that they can reassess your need for medications and monitor your lab values.  Discharge Instructions    Diet - low sodium heart healthy    Complete by:  As directed      Diet - low sodium heart healthy    Complete by:  As directed      Increase activity slowly    Complete by:  As directed      Increase activity slowly    Complete by:  As directed             Allergies  Allergen Reactions  . Ciprofloxacin Rash    On bilateral arms  . Other Other (See Comments)    Pt is a vegetarian and does not eat meat  . Citric Acid Hives      Disposition: 06-Home-Health Care Svc   Consults: Palliative care consultation  neurology     Significant Diagnostic Studies:  Ct Abdomen Wo Contrast  06/15/2015  CLINICAL DATA:  Gastrostomy tube workup EXAM: CT ABDOMEN WITHOUT CONTRAST TECHNIQUE: Multidetector CT imaging of the abdomen was performed following the standard protocol without IV contrast. COMPARISON:  None. FINDINGS: Linear atelectasis at the left lung base. Left main,  lad, and circumflex coronary artery calcification. The gastric body is well opposed to the anterior abdominal wall. Transverse colon resides below the gastric body. Anatomy is favorable for gastrostomy tube placement. Liver is unremarkable Pancreas is nonvisualized Spleen is unremarkable. Adrenal glands and kidneys are grossly within normal limits Pancreas is poorly visualized but is grossly within normal limits. Moderate stool burden throughout the transverse colon. Uterus is not clearly visualized and may have been resected. Unremarkable bladder and sigmoid colon. 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 There is anterolisthesis L4 upon L5. No pars defect. Left side of L5 is sacralized. There are degenerative changes in the hip joints. Atherosclerotic calcifications of the aorta and iliac vasculature are noted. IMPRESSION: Anatomy is favorable for gastrostomy tube placement No acute intra-abdominal pathology. Moderate stool  burden. INDICATION: Chest Electronically Signed   By: Jolaine Click M.D.   On: 06/15/2015 09:29   Dg Chest 2 View  06/12/2015  CLINICAL DATA:  Stroke.  Right-sided deficits and aphasia. EXAM: CHEST  2 VIEW COMPARISON:  05/11/2015 FINDINGS: The heart size and mediastinal contours are within normal limits. The pulmonary arteries appear prominent suggestive of PA hypertension. Both lungs are clear. The visualized skeletal structures are unremarkable. IMPRESSION: 1. No acute findings. 2. Prominent pulmonary arteries suggestive of PA hypertension. Electronically Signed   By: Signa Kell M.D.   On: 06/12/2015 16:34   Ct Head Wo Contrast  06/12/2015  CLINICAL DATA:  Altered mental status EXAM: CT HEAD WITHOUT CONTRAST TECHNIQUE: Contiguous axial images were obtained from the base of the skull through the vertex without intravenous contrast. COMPARISON:  05/12/2015 FINDINGS: The bony calvarium is intact. Atrophic changes and chronic white matter ischemic changes are seen similar  to that noted on prior MRI examination. No findings to suggest acute hemorrhage, acute infarction or space-occupying mass lesion are noted. IMPRESSION: No acute abnormality noted. Electronically Signed   By: Alcide Clever M.D.   On: 06/12/2015 16:56   Mr Maxine Glenn Head Wo Contrast  06/13/2015  CLINICAL DATA:  Initial evaluation for progressive right-sided weakness and aphasia with episodes of unresponsiveness. EXAM: MRI HEAD WITHOUT CONTRAST MRA HEAD WITHOUT CONTRAST MRA NECK WITHOUT AND WITH CONTRAST TECHNIQUE: Multiplanar, multiecho pulse sequences of the brain and surrounding structures were obtained without intravenous contrast. Angiographic images of the Circle of Willis were obtained using MRA technique without intravenous contrast. Angiographic images of the neck were obtained using MRA technique without and with intravenous contrast. Carotid stenosis measurements (when applicable) are obtained utilizing NASCET criteria, using the distal internal carotid diameter as the denominator. CONTRAST:  10mL MULTIHANCE GADOBENATE DIMEGLUMINE 529 MG/ML IV SOLN COMPARISON:  Previous CT from 06/12/2015 as well as previous brain MRI from 05/12/2015. FINDINGS: MRI HEAD FINDINGS Study somewhat degraded by motion artifact. Diffuse prominence of the CSF containing spaces compatible with generalized cerebral atrophy, fairly advanced in nature. Patchy T2/FLAIR hyperintensity within the periventricular and deep white matter both cerebral hemispheres most likely related chronic small vessel ischemic disease, moderate nature. Chronic small vessel ischemic type changes present within the pons. Cortical/subcortical encephalomalacia within the parasagittal left frontal lobe consistent with remote infarct, relatively small in nature, and stable relative to previous MRI. No other remote infarcts identified. No abnormal foci of restricted diffusion to suggest acute infarct. Gray-white matter differentiation maintained. Major intracranial  vascular flow voids are preserved. No acute intracranial hemorrhage. Small focus susceptibility air that within a left superior cerebellar vermis likely reflects a small chronic micro hemorrhage. No mass lesion, midline shift, or mass effect. Ventricular prominence related to global parenchymal volume loss of hydrocephalus. No extra-axial fluid collection. Major dural sinuses are patent. Craniocervical junction within normal limits. Visualized upper cervical spine unremarkable. Incidental note made of a partially empty sella. No acute abnormality about the orbits. Sequela prior lens extraction on the right. Paranasal sinuses are clear. No mastoid effusion. Inner ear structures normal. Bone marrow signal intensity within normal limits. No scalp soft tissue abnormality. MRA HEAD FINDINGS ANTERIOR CIRCULATION: Visualized distal cervical segments of the internal carotid arteries are patent with antegrade flow. Petrous segments patent. Mild atheromatous irregularity within the cavernous and supraclinoid ICAs without flow-limiting stenosis. ICA termini widely patent. There is a a saccular aneurysm at the level of the right posterior communicating artery measuring approximately 8 x 6 mm (series 4,  image 36). A1 segments patent. Anterior communicating artery normal. Anterior cerebral arteries are opacified to their distal aspect without definite flow limiting stenosis. Irregularity within the mid A2 segments favored to be related to motion. Right M1 segment patent without stenosis or occlusion. Right MCA bifurcation normal. Distal small vessel atheromatous irregularity within the right MCA branches. The proximal and mid aspect of the left A1 segment are widely patent. Absent signal void within the distal left M1 segment, favored to be artifactual, as a normal flow void is seen within this region on on the T2 weighted sequence of the corresponding brain. Flow was seen distally within the left MCA branches, but demonstrate  multifocal atheromatous irregularity. POSTERIOR CIRCULATION: Vertebral arteries patent to the vertebrobasilar junction. Left vertebral artery slightly dominant. Left posterior inferior cerebral artery patent. Right posterior inferior cerebral artery not well visualized. Basilar artery widely patent. Superior cerebellar arteries patent proximally. Left PCA arises from the basilar artery and is well opacified to its distal aspect. Fetal type right PCA supplied via the right posterior communicating artery. Right PCA is attenuated distally, and may be partially occluded. MRA NECK FINDINGS Visualized aortic arch is of normal caliber with normal branch pattern. No high-grade stenosis at the origin of the great vessels. Mild atheromatous narrowing of the proximal left subclavian artery without significant stenosis. Right common carotid artery patent from its origin to the bifurcation. No significant atheromatous narrowing about the right bifurcation. Right ICA widely patent from the bifurcation to the skullbase. No flow limiting stenosis, dissection, or vascular occlusion within the right carotid artery system. Left common carotid artery widely patent from its origin to the bifurcation. No significant stenosis about the left bifurcation. Left ICA widely patent from the bifurcation to the skullbase. No flow limiting stenosis, dissection, or vascular occlusion within the left carotid artery system. Vertebral arteries both arise from the subclavian arteries. Origin of the vertebral arteries not well seen on this exam. Vertebral arteries widely patent within the neck without stenosis, occlusion, or definite dissection. Left vertebral artery is dominant. IMPRESSION: MRI HEAD IMPRESSION: 1. No acute intracranial infarct or other process identified. 2. Age advanced cerebral atrophy with moderate chronic small vessel ischemic disease. 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 These findings are stable. MRA HEAD  IMPRESSION: 1. No large or proximal arterial branch occlusion within the intracranial circulation. No high-grade or correctable stenosis. 2. Attenuation of the distal right PCA, which may be occluded. 3. 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 There is a fetal type right PCA. 4. Distal small vessel irregularity within the intracranial circulation. MRA NECK IMPRESSION: Negative MRA of the neck. No flow limiting or critical stenosis identified. Electronically Signed   By: Rise Mu M.D.   On: 06/13/2015 05:53   Mr Angiogram Neck W Wo Contrast  06/13/2015  CLINICAL DATA:  Initial evaluation for progressive right-sided weakness and aphasia with episodes of unresponsiveness. EXAM: MRI HEAD WITHOUT CONTRAST MRA HEAD WITHOUT CONTRAST MRA NECK WITHOUT AND WITH CONTRAST TECHNIQUE: Multiplanar, multiecho pulse sequences of the brain and surrounding structures were obtained without intravenous contrast. Angiographic images of the Circle of Willis were obtained using MRA technique without intravenous contrast. Angiographic images of the neck were obtained using MRA technique without and with intravenous contrast. Carotid stenosis measurements (when applicable) are obtained utilizing NASCET criteria, using the distal internal carotid diameter as the denominator. CONTRAST:  10mL MULTIHANCE GADOBENATE DIMEGLUMINE 529 MG/ML IV SOLN COMPARISON:  Previous CT from 06/12/2015 as well as previous brain  MRI from 05/12/2015. FINDINGS: MRI HEAD FINDINGS Study somewhat degraded by motion artifact. Diffuse prominence of the CSF containing spaces compatible with generalized cerebral atrophy, fairly advanced in nature. Patchy T2/FLAIR hyperintensity within the periventricular and deep white matter both cerebral hemispheres most likely related chronic small vessel ischemic disease, moderate nature. Chronic small vessel ischemic type changes present within the pons. Cortical/subcortical encephalomalacia within the  parasagittal left frontal lobe consistent with remote infarct, relatively small in nature, and stable relative to previous MRI. No other remote infarcts identified. No abnormal foci of restricted diffusion to suggest acute infarct. Gray-white matter differentiation maintained. Major intracranial vascular flow voids are preserved. No acute intracranial hemorrhage. Small focus susceptibility air that within a left superior cerebellar vermis likely reflects a small chronic micro hemorrhage. No mass lesion, midline shift, or mass effect. Ventricular prominence related to global parenchymal volume loss of hydrocephalus. No extra-axial fluid collection. Major dural sinuses are patent. Craniocervical junction within normal limits. Visualized upper cervical spine unremarkable. Incidental note made of a partially empty sella. No acute abnormality about the orbits. Sequela prior lens extraction on the right. Paranasal sinuses are clear. No mastoid effusion. Inner ear structures normal. Bone marrow signal intensity within normal limits. No scalp soft tissue abnormality. MRA HEAD FINDINGS ANTERIOR CIRCULATION: Visualized distal cervical segments of the internal carotid arteries are patent with antegrade flow. Petrous segments patent. Mild atheromatous irregularity within the cavernous and supraclinoid ICAs without flow-limiting stenosis. ICA termini widely patent. There is a a saccular aneurysm at the level of the right posterior communicating artery measuring approximately 8 x 6 mm (series 4, image 36). A1 segments patent. Anterior communicating artery normal. Anterior cerebral arteries are opacified to their distal aspect without definite flow limiting stenosis. Irregularity within the mid A2 segments favored to be related to motion. Right M1 segment patent without stenosis or occlusion. Right MCA bifurcation normal. Distal small vessel atheromatous irregularity within the right MCA branches. The proximal and mid aspect of  the left A1 segment are widely patent. Absent signal void within the distal left M1 segment, favored to be artifactual, as a normal flow void is seen within this region on on the T2 weighted sequence of the corresponding brain. Flow was seen distally within the left MCA branches, but demonstrate multifocal atheromatous irregularity. POSTERIOR CIRCULATION: Vertebral arteries patent to the vertebrobasilar junction. Left vertebral artery slightly dominant. Left posterior inferior cerebral artery patent. Right posterior inferior cerebral artery not well visualized. Basilar artery widely patent. Superior cerebellar arteries patent proximally. Left PCA arises from the basilar artery and is well opacified to its distal aspect. Fetal type right PCA supplied via the right posterior communicating artery. Right PCA is attenuated distally, and may be partially occluded. MRA NECK FINDINGS Visualized aortic arch is of normal caliber with normal branch pattern. No high-grade stenosis at the origin of the great vessels. Mild atheromatous narrowing of the proximal left subclavian artery without significant stenosis. Right common carotid artery patent from its origin to the bifurcation. No significant atheromatous narrowing about the right bifurcation. Right ICA widely patent from the bifurcation to the skullbase. No flow limiting stenosis, dissection, or vascular occlusion within the right carotid artery system. Left common carotid artery widely patent from its origin to the bifurcation. No significant stenosis about the left bifurcation. Left ICA widely patent from the bifurcation to the skullbase. No flow limiting stenosis, dissection, or vascular occlusion within the left carotid artery system. Vertebral arteries both arise from the subclavian arteries. Origin of the vertebral  arteries not well seen on this exam. Vertebral arteries widely patent within the neck without stenosis, occlusion, or definite dissection. Left vertebral  artery is dominant. IMPRESSION: MRI HEAD IMPRESSION: 1. No acute intracranial infarct or other process identified. 2. Age advanced cerebral atrophy with moderate chronic small vessel ischemic disease. 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 These findings are stable. MRA HEAD IMPRESSION: 1. No large or proximal arterial branch occlusion within the intracranial circulation. No high-grade or correctable stenosis. 2. Attenuation of the distal right PCA, which may be occluded. 3. 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 There is a fetal type right PCA. 4. Distal small vessel irregularity within the intracranial circulation. MRA NECK IMPRESSION: Negative MRA of the neck. No flow limiting or critical stenosis identified. Electronically Signed   By: Rise Mu M.D.   On: 06/13/2015 05:53   Mr Brain Wo Contrast  06/13/2015  CLINICAL DATA:  Initial evaluation for progressive right-sided weakness and aphasia with episodes of unresponsiveness. EXAM: MRI HEAD WITHOUT CONTRAST MRA HEAD WITHOUT CONTRAST MRA NECK WITHOUT AND WITH CONTRAST TECHNIQUE: Multiplanar, multiecho pulse sequences of the brain and surrounding structures were obtained without intravenous contrast. Angiographic images of the Circle of Willis were obtained using MRA technique without intravenous contrast. Angiographic images of the neck were obtained using MRA technique without and with intravenous contrast. Carotid stenosis measurements (when applicable) are obtained utilizing NASCET criteria, using the distal internal carotid diameter as the denominator. CONTRAST:  10mL MULTIHANCE GADOBENATE DIMEGLUMINE 529 MG/ML IV SOLN COMPARISON:  Previous CT from 06/12/2015 as well as previous brain MRI from 05/12/2015. FINDINGS: MRI HEAD FINDINGS Study somewhat degraded by motion artifact. Diffuse prominence of the CSF containing spaces compatible with generalized cerebral atrophy, fairly advanced in nature. Patchy T2/FLAIR  hyperintensity within the periventricular and deep white matter both cerebral hemispheres most likely related chronic small vessel ischemic disease, moderate nature. Chronic small vessel ischemic type changes present within the pons. Cortical/subcortical encephalomalacia within the parasagittal left frontal lobe consistent with remote infarct, relatively small in nature, and stable relative to previous MRI. No other remote infarcts identified. No abnormal foci of restricted diffusion to suggest acute infarct. Gray-white matter differentiation maintained. Major intracranial vascular flow voids are preserved. No acute intracranial hemorrhage. Small focus susceptibility air that within a left superior cerebellar vermis likely reflects a small chronic micro hemorrhage. No mass lesion, midline shift, or mass effect. Ventricular prominence related to global parenchymal volume loss of hydrocephalus. No extra-axial fluid collection. Major dural sinuses are patent. Craniocervical junction within normal limits. Visualized upper cervical spine unremarkable. Incidental note made of a partially empty sella. No acute abnormality about the orbits. Sequela prior lens extraction on the right. Paranasal sinuses are clear. No mastoid effusion. Inner ear structures normal. Bone marrow signal intensity within normal limits. No scalp soft tissue abnormality. MRA HEAD FINDINGS ANTERIOR CIRCULATION: Visualized distal cervical segments of the internal carotid arteries are patent with antegrade flow. Petrous segments patent. Mild atheromatous irregularity within the cavernous and supraclinoid ICAs without flow-limiting stenosis. ICA termini widely patent. There is a a saccular aneurysm at the level of the right posterior communicating artery measuring approximately 8 x 6 mm (series 4, image 36). A1 segments patent. Anterior communicating artery normal. Anterior cerebral arteries are opacified to their distal aspect without definite flow  limiting stenosis. Irregularity within the mid A2 segments favored to be related to motion. Right M1 segment patent without stenosis or occlusion. Right MCA bifurcation normal. Distal small vessel atheromatous  irregularity within the right MCA branches. The proximal and mid aspect of the left A1 segment are widely patent. Absent signal void within the distal left M1 segment, favored to be artifactual, as a normal flow void is seen within this region on on the T2 weighted sequence of the corresponding brain. Flow was seen distally within the left MCA branches, but demonstrate multifocal atheromatous irregularity. POSTERIOR CIRCULATION: Vertebral arteries patent to the vertebrobasilar junction. Left vertebral artery slightly dominant. Left posterior inferior cerebral artery patent. Right posterior inferior cerebral artery not well visualized. Basilar artery widely patent. Superior cerebellar arteries patent proximally. Left PCA arises from the basilar artery and is well opacified to its distal aspect. Fetal type right PCA supplied via the right posterior communicating artery. Right PCA is attenuated distally, and may be partially occluded. MRA NECK FINDINGS Visualized aortic arch is of normal caliber with normal branch pattern. No high-grade stenosis at the origin of the great vessels. Mild atheromatous narrowing of the proximal left subclavian artery without significant stenosis. Right common carotid artery patent from its origin to the bifurcation. No significant atheromatous narrowing about the right bifurcation. Right ICA widely patent from the bifurcation to the skullbase. No flow limiting stenosis, dissection, or vascular occlusion within the right carotid artery system. Left common carotid artery widely patent from its origin to the bifurcation. No significant stenosis about the left bifurcation. Left ICA widely patent from the bifurcation to the skullbase. No flow limiting stenosis, dissection, or vascular  occlusion within the left carotid artery system. Vertebral arteries both arise from the subclavian arteries. Origin of the vertebral arteries not well seen on this exam. Vertebral arteries widely patent within the neck without stenosis, occlusion, or definite dissection. Left vertebral artery is dominant. IMPRESSION: MRI HEAD IMPRESSION: 1. No acute intracranial infarct or other process identified. 2. Age advanced cerebral atrophy with moderate chronic small vessel ischemic disease. 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 These findings are stable. MRA HEAD IMPRESSION: 1. No large or proximal arterial branch occlusion within the intracranial circulation. No high-grade or correctable stenosis. 2. Attenuation of the distal right PCA, which may be occluded. 3. 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 There is a fetal type right PCA. 4. Distal small vessel irregularity within the intracranial circulation. MRA NECK IMPRESSION: Negative MRA of the neck. No flow limiting or critical stenosis identified. Electronically Signed   By: Rise Mu M.D.   On: 06/13/2015 05:53   Ir Gastrostomy Tube Mod Sed  06/15/2015  INDICATION: Stroke EXAM: PERC PLACEMENT GASTROSTOMY MEDICATIONS: ; Antibiotics were administered within 1 hour of the procedure. Glucagon 1 mg IV ANESTHESIA/SEDATION: Versed 1 mg IV; Fentanyl 75 mcg IV Moderate Sedation Time:  10 The patient was continuously monitored during the procedure by the interventional radiology nurse under my direct supervision. CONTRAST:  10 cc Omnipaque 300 - administered into the gastric lumen. FLUOROSCOPY TIME:  Fluoroscopy Time: 6 minutes 0 seconds (8 mGy). COMPLICATIONS: None immediate. PROCEDURE: The procedure, risks, benefits, and alternatives were explained to the patient. Questions regarding the procedure were encouraged and answered. The patient understands and consents to the procedure. The epigastrium was prepped with Betadine in a  sterile fashion, and a sterile drape was applied covering the operative field. A sterile gown and sterile gloves were used for the procedure. A 5-French orogastric tube is placed under fluoroscopic guidance. Scout imaging of the abdomen confirms barium within the transverse colon. The stomach was distended with gas. Under fluoroscopic guidance, an 45  gauge needle was utilized to puncture the anterior wall of the body of the stomach. An Amplatz wire was advanced through the needle passing a T fastener into the lumen of the stomach. The T fastener was secured for gastropexy. A 9-French sheath was inserted. A snare was advanced through the 9-French sheath. A Teena Dunk was advanced through the orogastric tube. It was snared then pulled out the oral cavity, pulling the snare, as well. The leading edge of the gastrostomy was attached to the snare. It was then pulled down the esophagus and out the percutaneous site. It was secured in place. Contrast was injected. The image demonstrates placement of a 20-French pull-through type gastrostomy tube into the body of the stomach. IMPRESSION: Successful 20 French pull-through gastrostomy. Electronically Signed   By: Jolaine Click M.D.   On: 06/15/2015 14:36   Ir US Guide Vasc Access Right  06/15/2015  INDICATION: Poor IV access EXAM: RIGHT UPPER EXTREMITY PICC LINE PLACEMENT WITH ULTRASOUND AND FLUOROSCOPIC GUIDANCE MEDICATIONS: ; The antibiotic was administered within an appropriate time interval prior to skin puncture. ANESTHESIA/SEDATION: None FLUOROSCOPY TIME:  Fluoroscopy Time:  minutes 12 seconds ( mGy). COMPLICATIONS: None immediate. PROCEDURE: The patient was advised of the possible risks and complications and agreed to undergo the procedure. The patient was then brought to the angiographic suite for the procedure. The right arm was prepped with chlorhexidine, draped in the usual sterile fashion using maximum barrier technique (cap and mask, sterile gown, sterile gloves,  large sterile sheet, hand hygiene and cutaneous antisepsis) and infiltrated locally with 1% Lidocaine. Ultrasound demonstrated patency of the right basilar vein, and this was documented with an image. Under real-time ultrasound guidance, this vein was accessed with a 21 gauge micropuncture needle and image documentation was performed. A 0.018 wire was introduced in to the vein. Over this, a 5 [place] double lumen power PICC was advanced to the lower SVC/right atrial junction. Fluoroscopy during the procedure and fluoro spot radiograph confirms appropriate catheter position. The catheter was flushed and covered with asterile dressing. Catheter length: 44 cm IMPRESSION: Successful right arm power PICC line placement with ultrasound and fluoroscopic guidance. The catheter is ready for use. Electronically Signed   By: Jolaine Click M.D.   On: 06/15/2015 14:36   Ir Fluoro Guide Cv Midline Picc Right  06/15/2015  INDICATION: Poor IV access EXAM: RIGHT UPPER EXTREMITY PICC LINE PLACEMENT WITH ULTRASOUND AND FLUOROSCOPIC GUIDANCE MEDICATIONS: ; The antibiotic was administered within an appropriate time interval prior to skin puncture. ANESTHESIA/SEDATION: None FLUOROSCOPY TIME:  Fluoroscopy Time:  minutes 12 seconds ( mGy). COMPLICATIONS: None immediate. PROCEDURE: The patient was advised of the possible risks and complications and agreed to undergo the procedure. The patient was then brought to the angiographic suite for the procedure. The right arm was prepped with chlorhexidine, draped in the usual sterile fashion using maximum barrier technique (cap and mask, sterile gown, sterile gloves, large sterile sheet, hand hygiene and cutaneous antisepsis) and infiltrated locally with 1% Lidocaine. Ultrasound demonstrated patency of the right basilar vein, and this was documented with an image. Under real-time ultrasound guidance, this vein was accessed with a 21 gauge micropuncture needle and image documentation was performed.  A 0.018 wire was introduced in to the vein. Over this, a 5 [place] double lumen power PICC was advanced to the lower SVC/right atrial junction. Fluoroscopy during the procedure and fluoro spot radiograph confirms appropriate catheter position. The catheter was flushed and covered with asterile dressing. Catheter length: 44 cm IMPRESSION:  Successful right arm power PICC line placement with ultrasound and fluoroscopic guidance. The catheter is ready for use. Electronically Signed   By: Jolaine Click M.D.   On: 06/15/2015 14:36      2-D echo LV EF: 65% - 70%  ------------------------------------------------------------------- Indications: Syncope 780.2.  ------------------------------------------------------------------- History: PMH: Altered mental status. Stroke. Risk factors: Dyslipidemia.  ------------------------------------------------------------------- Study Conclusions  - Left ventricle: The cavity size was normal. Wall thickness was  increased in a pattern of mild LVH. Systolic function was  vigorous. The estimated ejection fraction was in the range of 65%  to 70%. Wall motion was normal; there were no regional wall  motion abnormalities. Doppler parameters are consistent with  abnormal left ventricular relaxation (grade 1 diastolic  dysfunction). The E/e&' ratio is between 8-15, suggesting  indeterminate LV filling pressure. - Mitral valve: Calcified annulus. Mildly thickened leaflets .  There was mild regurgitation. - Left atrium: The atrium was normal in size. - Tricuspid valve: There was moderate regurgitation. - Pulmonary arteries: PA peak pressure: 47 mm Hg (S). - Inferior vena cava: The vessel was normal in size. The  respirophasic diameter changes were in the normal range (>= 50%),  consistent with normal central venous pressure.  Impressions:  - LVEF 65-70%, mild LVH, normal wall motion, diastolic dysfunction,  indeterminate LV filling  pressure, mild MR, normal biatrial size,  moderate TR, RVSP 47 mmHg, normal IVC.   Filed Weights   06/15/15 0423 06/17/15 0509 06/18/15 0447  Weight: 49.9 kg (110 lb 0.2 oz) 52.935 kg (116 lb 11.2 oz) 52.8 kg (116 lb 6.5 oz)     Microbiology: No results found for this or any previous visit (from the past 240 hour(s)).     Blood Culture     Labs: Results for orders placed or performed during the hospital encounter of 06/12/15 (from the past 48 hour(s))  Glucose, capillary     Status: Abnormal   Collection Time: 06/16/15  9:38 PM  Result Value Ref Range   Glucose-Capillary 147 (H) 65 - 99 mg/dL  Glucose, capillary     Status: Abnormal   Collection Time: 06/17/15  1:45 AM  Result Value Ref Range   Glucose-Capillary 114 (H) 65 - 99 mg/dL  Glucose, capillary     Status: Abnormal   Collection Time: 06/17/15  5:05 AM  Result Value Ref Range   Glucose-Capillary 113 (H) 65 - 99 mg/dL  Glucose, capillary     Status: Abnormal   Collection Time: 06/17/15  9:43 AM  Result Value Ref Range   Glucose-Capillary 120 (H) 65 - 99 mg/dL  Glucose, capillary     Status: Abnormal   Collection Time: 06/17/15 12:46 PM  Result Value Ref Range   Glucose-Capillary 109 (H) 65 - 99 mg/dL  Glucose, capillary     Status: Abnormal   Collection Time: 06/17/15  4:22 PM  Result Value Ref Range   Glucose-Capillary 116 (H) 65 - 99 mg/dL  Glucose, capillary     Status: Abnormal   Collection Time: 06/17/15  9:05 PM  Result Value Ref Range   Glucose-Capillary 114 (H) 65 - 99 mg/dL  Glucose, capillary     Status: None   Collection Time: 06/18/15  1:07 AM  Result Value Ref Range   Glucose-Capillary 86 65 - 99 mg/dL  Glucose, capillary     Status: Abnormal   Collection Time: 06/18/15  4:43 AM  Result Value Ref Range   Glucose-Capillary 101 (H) 65 - 99 mg/dL  Glucose,  capillary     Status: Abnormal   Collection Time: 06/18/15  8:15 AM  Result Value Ref Range   Glucose-Capillary 122 (H) 65 - 99 mg/dL   Glucose, capillary     Status: Abnormal   Collection Time: 06/18/15 12:19 PM  Result Value Ref Range   Glucose-Capillary 110 (H) 65 - 99 mg/dL     Lipid Panel  No results found for: CHOL, TRIG, HDL, CHOLHDL, VLDL, LDLCALC, LDLDIRECT   No results found for: HGBA1C   Lab Results  Component Value Date   CREATININE 0.60 06/16/2015     82 y.o. female, With history of large left frontal lobe ischemic CVA in the past, aphasic at baseline, dense right-sided hemiparesis with bedbound status at baseline,presents with transient episode of unresponsiveness. She was being seen by a home health aide who apparently observed that she had sudden episode of staring. He called the husband advised that they called 911. She apparently has had several of these episodes in the past, and was started on Keppra for them one month ago. Patient also has a history of essential hypertension, dyslipidemia, depression, UTI in the past, questionable seizures who lives at home, is bedbound, is aphasic at baseline,. In the ER patient was found to be hypotensive, initial lab work was unremarkable, UA suspicious for UTI but specimen likely was not of good quality, she is afebrile, with the help of husband limited review of systems was obtained as below but was unremarkable. After receiving some IV fluid in the ER patient is already improving and getting close to her baseline.  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 PENUMALLI, MD -S/p palliative meeting, family wants full aggressive scope of Rx, Full Code   -Doubt UTI,  -UA not impressive, no fevers, leukocytosis -Off Ceftriaxone anyway, will not resume  Hypokalemia-Continue potassium and magnesium supplements along with 2 feeding  Dyslipidemia-continue statin  History of CVA-, continue nothing by mouth, palliative care consult -Family requested full scope of treatment, Started seroquel /Haldol for agitation -PEG placed,Initiated on tube feeds, Jevity 1.2 CAL formula at 15 ml/hr and increase by 10 ml every 6 hours until goal rate of 45 ml/hr, currently at goal. Home health RN to monitor   History of seizures-Continue Keppra via PEG tube  Troponin elevation without non-ST elevation MI - EKG non acute, denies Chest pain, Trend Trop, ASA-Statin to continue. Tele. 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 rate 18, height 5\' 5"  (  1.651 m), weight 52.8 kg (116 lb 6.5 oz), SpO2 100 %.   General exam: awake, non verbal, mouth open, tracks Respiratory system: Clear to auscultation. Respiratory effort normal. Cardiovascular system: S1 & S2 heard, sinus tach . Gastrointestinal system: Abdomen is nondistended, soft and nontender. No organomegaly or masses felt. Normal bowel sounds heard. G tube noted Central nervous system: Alert  only Extremities: swelling of all ext, esp RUE Skin: No rashes, lesions or ulcers Psychiatry: unable to assess   Follow-up Information    Follow up with PCP. Schedule an appointment as soon as possible for a visit in 3 days.   Why:  Hospital follow-up      Signed: Richarda Overlie 06/18/2015, 1:05 PM        Time spent >45 mins

## 2015-08-25 ENCOUNTER — Encounter (HOSPITAL_COMMUNITY): Payer: Self-pay | Admitting: Emergency Medicine

## 2015-08-25 ENCOUNTER — Emergency Department (HOSPITAL_COMMUNITY)
Admission: EM | Admit: 2015-08-25 | Discharge: 2015-09-18 | Disposition: E | Payer: Medicare Other | Attending: Emergency Medicine | Admitting: Emergency Medicine

## 2015-08-25 DIAGNOSIS — I10 Essential (primary) hypertension: Secondary | ICD-10-CM | POA: Insufficient documentation

## 2015-08-25 DIAGNOSIS — Z79899 Other long term (current) drug therapy: Secondary | ICD-10-CM | POA: Insufficient documentation

## 2015-08-25 DIAGNOSIS — Z8673 Personal history of transient ischemic attack (TIA), and cerebral infarction without residual deficits: Secondary | ICD-10-CM | POA: Insufficient documentation

## 2015-08-25 DIAGNOSIS — Z7982 Long term (current) use of aspirin: Secondary | ICD-10-CM | POA: Insufficient documentation

## 2015-08-25 DIAGNOSIS — I469 Cardiac arrest, cause unspecified: Secondary | ICD-10-CM

## 2015-09-18 NOTE — ED Notes (Addendum)
Family removed ETT tube

## 2015-09-18 NOTE — Progress Notes (Signed)
   28-Mar-2015 1300  Clinical Encounter Type  Visited With Family  Visit Type ED  Referral From Nurse  Consult/Referral To Chaplain  Spiritual Encounters  Spiritual Needs Grief support  Stress Factors  Family Stress Factors Loss  In progress CPR. Patient passed. CHP provided emotional support to husband, contacted family member and provided continued support to several family members. Rodney BoozeGail L Tijuan Dantes January 03, 2016

## 2015-09-18 NOTE — ED Provider Notes (Signed)
CSN: [id number]     Arrival date & time 08/24/2015  1049 History   First MD Initiated Contact with Patient 16-Feb-2016 1105     Chief Complaint  Patient presents with  . CPR      (Consider location/radiation/quality/duration/timing/severity/associated sxs/prior Treatment) HPI 82 y.o. Female h.o. cva who has been less responsive than usual.  Husband called ems and found tachypneic then became unresponsive with pea as initial rhythm.  Pre hospital treatment included cpr, defibrillation,and intubation with i/o placed and multiple medications- epi and amiodarone given. Patient asystolic after and continued in asystole and unresponsive en route. Past Medical History  Diagnosis Date  . Cataract   . Essential hypertension   . Depression   . Hyperlipidemia   . Stroke [facility])    Past Surgical History  Procedure Laterality Date  . Tubal ligation    . Hernia repair      hiatal, years ago  . Cataract extraction Right 10/2013  . Tonsillectomy    . Iud removed      due to infection   Family History  Problem Relation Age of Onset  . Diabetes Mother   . Diabetes Father   . Alzheimer's disease Father    Social History  Substance Use Topics  . Smoking status: Never Smoker   . Smokeless tobacco: Never Used  . Alcohol Use: No   OB History    No data available     Review of Systems  Unable to perform ROS: Acuity of condition      Allergies  Ciprofloxacin; Other; and Citric acid  Home Medications   Prior to Admission medications   Medication Sig Start Date End Date Taking? Authorizing Provider  baclofen (LIORESAL) 10 MG tablet Take 5 mg by mouth at bedtime. For 30 days ending 06-05-15 05/05/15   Historical Provider, MD  [facility] ASPIRIN 325 MG tablet Take 325 mg by mouth daily. 04/05/15   Historical Provider, MD  docusate sodium (COLACE) 100 MG capsule Take 100 mg by mouth 2 (two) times daily. 05/04/15   Historical Provider, MD  HYDROcodone-acetaminophen (NORCO/VICODIN) 5-325 MG tablet Take 1  tablet by mouth every 4 (four) hours as needed for moderate pain. 06/18/15   Richarda OverlieNayana Abrol, MD  levETIRAcetam (KEPPRA) 750 MG tablet Take 1 tablet (750 mg total) by mouth 2 (two) times daily. 06/18/15   Richarda OverlieNayana Abrol, MD  magnesium oxide (MAG-OX) 400 (241.3 Mg) MG tablet Take 1 tablet (400 mg total) by mouth 2 (two) times daily. 06/18/15   Richarda OverlieNayana Abrol, MD  metoprolol tartrate (LOPRESSOR) 25 MG tablet Place 0.5 tablets (12.5 mg total) into feeding tube 2 (two) times daily. 06/18/15   Richarda OverlieNayana Abrol, MD  Multiple Vitamin (MULTIVITAMIN) tablet Take 1 tablet by mouth daily.    Historical Provider, MD  Nutritional Supplements (FEEDING SUPPLEMENT, JEVITY 1.2 CAL,) LIQD Place 1,000 mLs into feeding tube continuous. 06/18/15   Richarda OverlieNayana Abrol, MD  potassium chloride 20 MEQ/15ML (10%) SOLN Take 30 mLs (40 mEq total) by mouth daily. 06/18/15   Richarda OverlieNayana Abrol, MD  pravastatin (PRAVACHOL) 10 MG tablet Take 10 mg by mouth daily at 6 PM. 04/25/15   Historical Provider, MD  QUEtiapine (SEROQUEL) 25 MG tablet Take 1 tablet (25 mg total) by mouth at bedtime. 06/18/15   Richarda OverlieNayana Abrol, MD  traZODone (DESYREL) 50 MG tablet Take 50 mg by mouth at bedtime. 05/08/15   Historical Provider, MD   Wt 52.617 kg Physical Exam  Constitutional:  Elderly chronically ill-appearing female unresponsive with external compression in  place. Patient intubated. Patient unresponsive  HENT:  Head: Atraumatic.  Right Ear: External ear normal.  Left Ear: External ear normal.  Eyes:  Pupils unreactive  Neck: Normal range of motion.  Cardiovascular:  No heart sounds auscultated. Ultrasound probe used and no cardiac activity noted  Pulmonary/Chest:  ET tube in place with good bilateral breath sounds with bag valve   Abdominal: Bowel sounds are normal.  Musculoskeletal:  No obvious abnormalities noted  Neurological:  Patient unresponsive  Nursing note reviewed.   ED Course  Procedures (including critical care time) Labs Review Labs Reviewed - No data  to display  Imaging Review No results found. I have personally reviewed and evaluated these images and lab results as part of my medical decision-making.   EKG Interpretation None      MDM   Final diagnoses:  Cardiac arrest [facility])   82 year old female presents in cardiac arrest. I discussed patient's care with her husband. Chaplain was present with me. We are waiting information regarding primary care to send death certificate  Margarita Grizzle, MD Sep 04, 2015 1227

## 2015-09-18 NOTE — ED Notes (Signed)
Received pt from home with c/o initial call came out that pt feeding tube was not working and pt had AMS. [facility] When EMS got pt into truck, pt breathing became Agonal and went into a PEA rhythm. EMS gave pt 1 epi and pt went into V-tach. EMS shocked pt x 1 at 200 J, pt went into PEA. Pt had a total of 4 epi and 200 mg of Amiodarone by EMS. CAP for EMS was 3.

## 2015-09-18 DEATH — deceased

## 2016-08-23 IMAGING — CT CT ABDOMEN W/O CM
2 of 4 series · 16 of 46 positions shown, 18 images · non-contrast
Comparison: None.

CLINICAL DATA: Gastrostomy tube workup

EXAM:
CT ABDOMEN WITHOUT CONTRAST
TECHNIQUE: Multidetector CT imaging of the abdomen was performed following the
standard protocol without IV contrast.
INDICATION: Chest

[Series 2: abd/ pelvis 5.0 i30f 1 · axial · 0.65mm/px · z∈[+1130,+1540]mm · 13 of 90 slices shown, 15 images]
[im 4/90  soft-tissue]
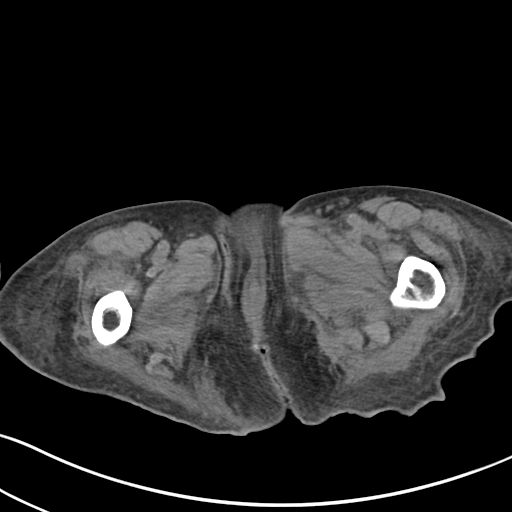
[im 4/90  bone]
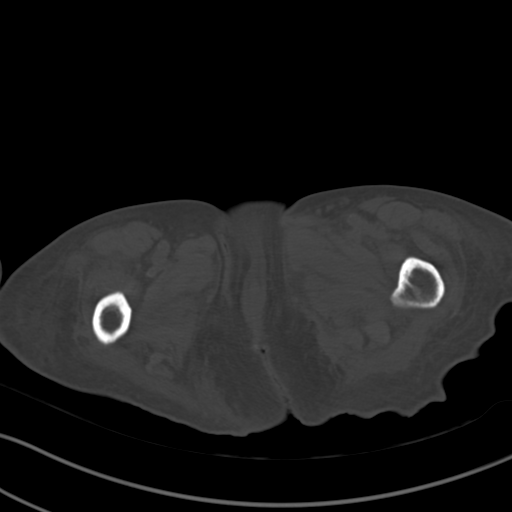
[im 11/90  soft-tissue]
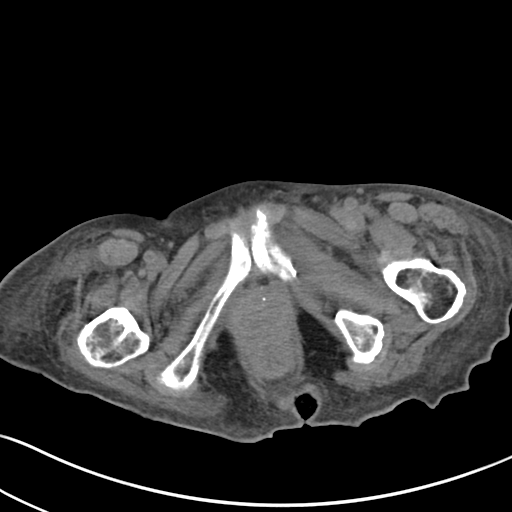
[im 18/90  soft-tissue]
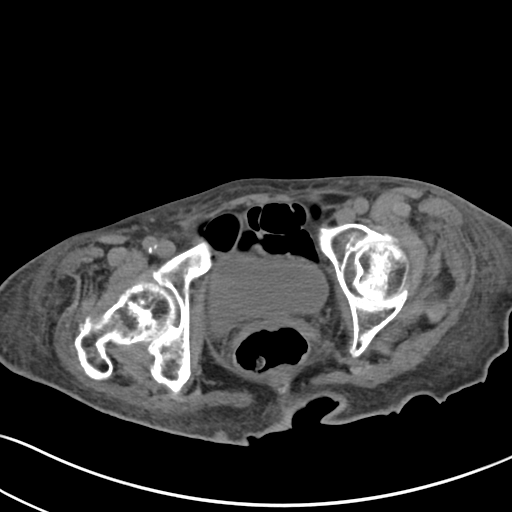
[im 25/90  soft-tissue]
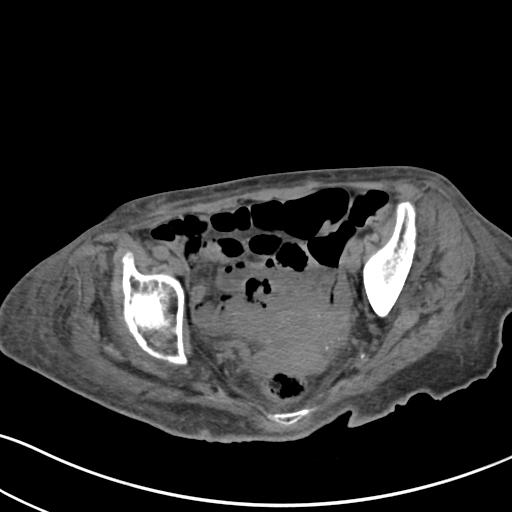
[im 33/90  soft-tissue]
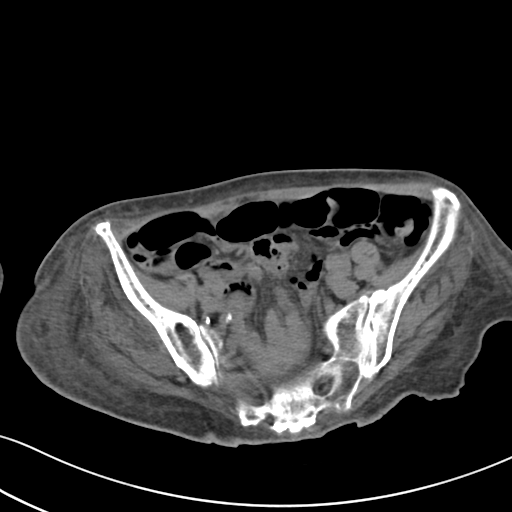
[im 40/90  soft-tissue]
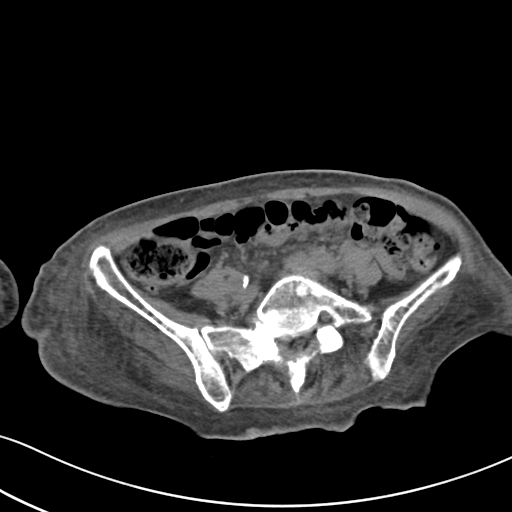
[im 47/90  soft-tissue]
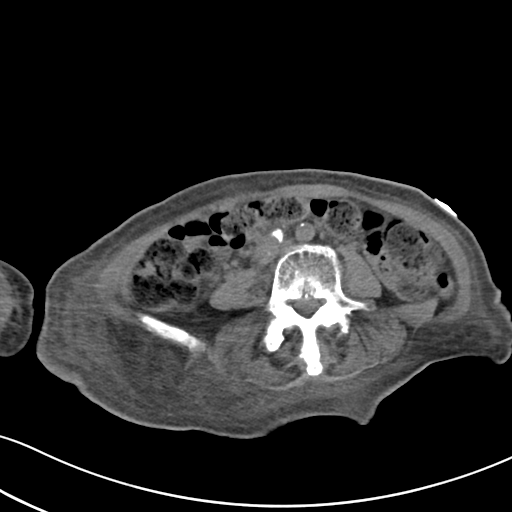
[im 50/90  soft-tissue]
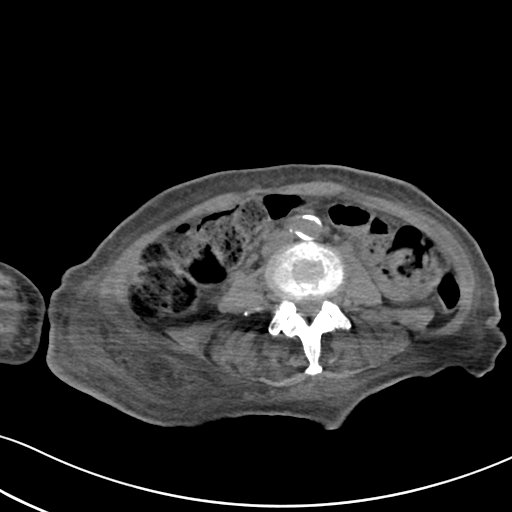
[im 57/90  soft-tissue]
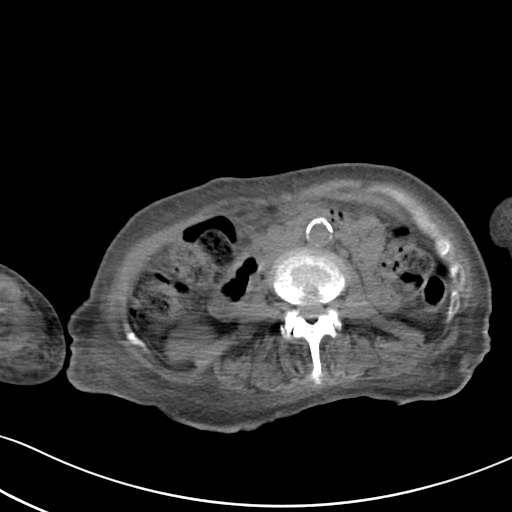
[im 57/90  bone]
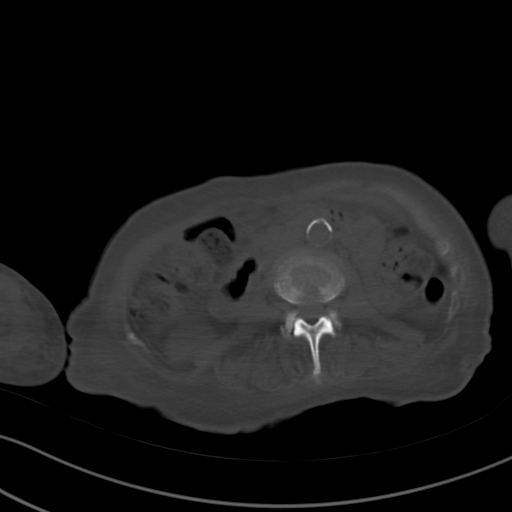
[im 65/90  soft-tissue]
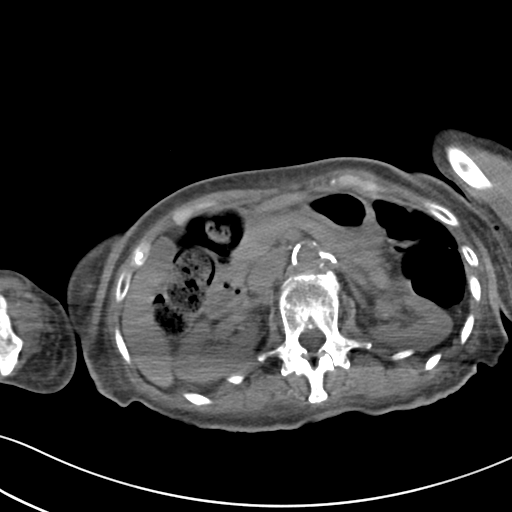
[im 72/90  soft-tissue]
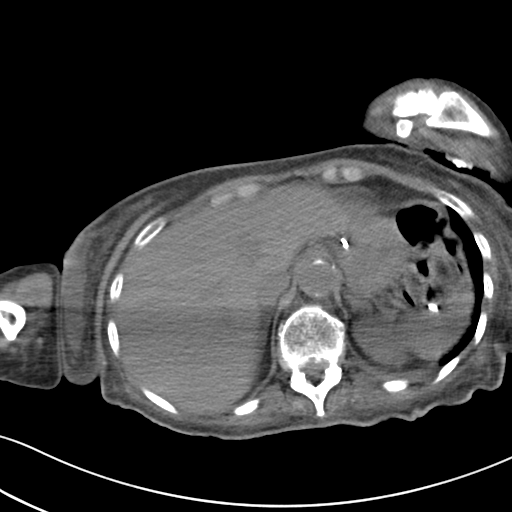
[im 79/90  soft-tissue]
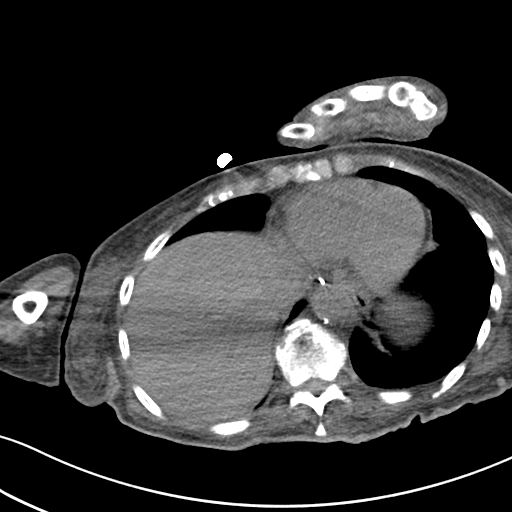
[im 86/90  soft-tissue]
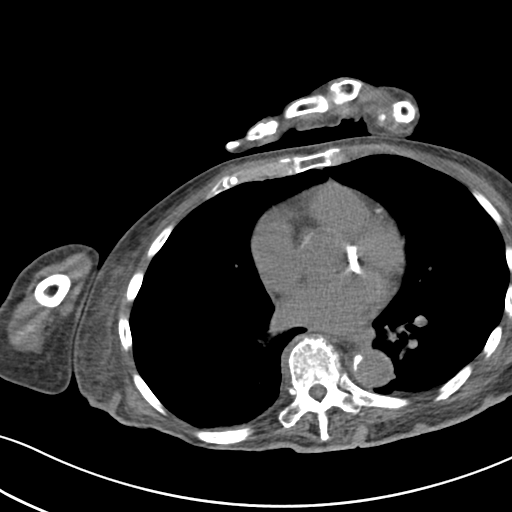

[Series 5: cor st · coronal · 0.88mm/px · 3 of 56 slices shown]
[im 19/56  soft-tissue]
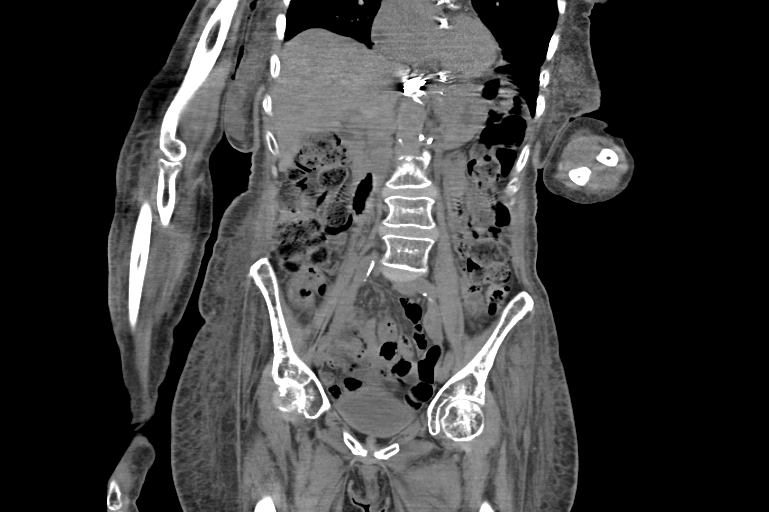
[im 25/56  soft-tissue]
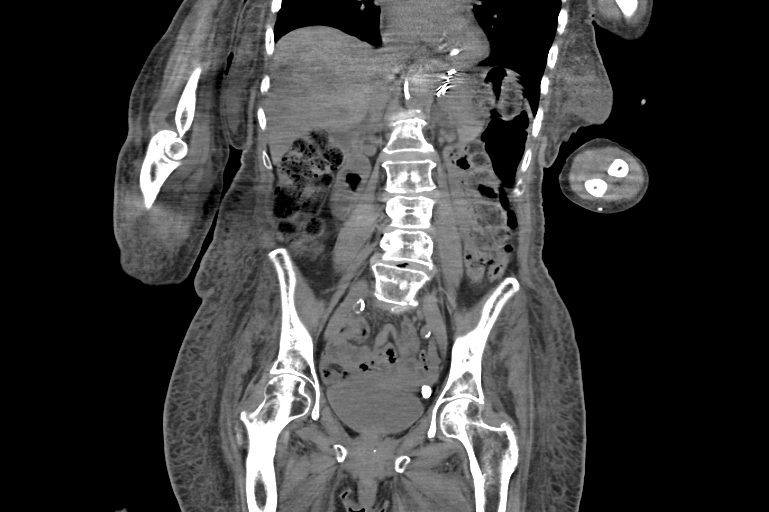
[im 31/56  soft-tissue]
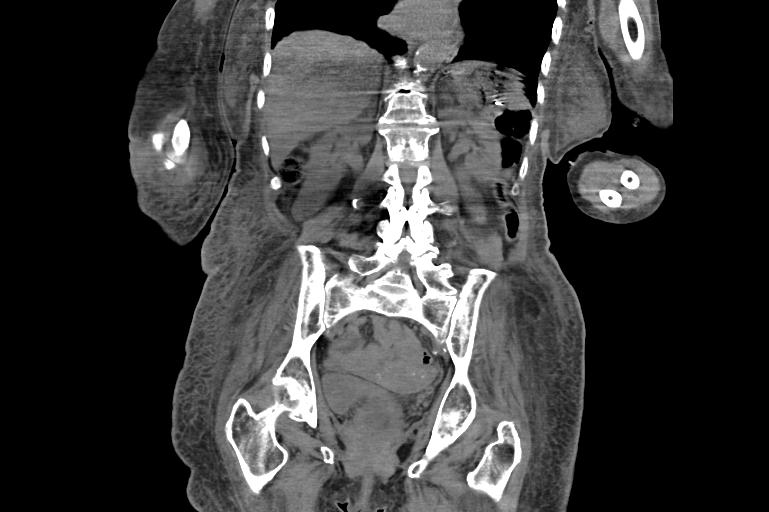

[16 of 46 positions shown; findings below may reference images not displayed]

FINDINGS: Linear atelectasis at the left lung base.

Left main, lad, and circumflex coronary artery calcification.

The gastric body is well opposed to the anterior abdominal wall.
Transverse colon resides below the gastric body. Anatomy is
favorable for gastrostomy tube placement.

Liver is unremarkable

Pancreas is nonvisualized

Spleen is unremarkable.

Adrenal glands and kidneys are grossly within normal limits

Pancreas is poorly visualized but is grossly within normal limits.

Moderate stool burden throughout the transverse colon.

Uterus is not clearly visualized and may have been resected.

Unremarkable bladder and sigmoid colon.

Advanced degenerative disc disease in the lumbar spine. There is
anterolisthesis L4 upon L5. No pars defect. Left side of L5 is
sacralized. There are degenerative changes in the hip joints.

Atherosclerotic calcifications of the aorta and iliac vasculature
are noted.
IMPRESSION: Anatomy is favorable for gastrostomy tube placement

No acute intra-abdominal pathology.

Moderate stool burden.
